# Patient Record
Sex: Female | Born: 1980
Health system: Southern US, Community
[De-identification: ages and names within clinical notes are randomized; demographics above are authoritative.]

## PROBLEM LIST (undated history)

## (undated) ENCOUNTER — Inpatient Hospital Stay (HOSPITAL_COMMUNITY): Payer: PRIVATE HEALTH INSURANCE | Source: Ambulatory Visit

## (undated) DIAGNOSIS — J45909 Unspecified asthma, uncomplicated: Secondary | ICD-10-CM

## (undated) DIAGNOSIS — Z86000 Personal history of in-situ neoplasm of breast: Secondary | ICD-10-CM

## (undated) DIAGNOSIS — Z8619 Personal history of other infectious and parasitic diseases: Secondary | ICD-10-CM

## (undated) HISTORY — PX: WISDOM TOOTH EXTRACTION: SHX21

---

## 2007-11-22 ENCOUNTER — Inpatient Hospital Stay (HOSPITAL_COMMUNITY): Admission: AD | Admit: 2007-11-22 | Discharge: 2007-11-24 | Payer: Self-pay | Admitting: Obstetrics and Gynecology

## 2011-05-10 LAB — CBC
HCT: 33.5 — ABNORMAL LOW
MCHC: 34.4
MCHC: 34.5
Platelets: 157
Platelets: 174
RBC: 3.22 — ABNORMAL LOW
RDW: 13.3

## 2011-07-21 LAB — OB RESULTS CONSOLE ANTIBODY SCREEN: Antibody Screen: NEGATIVE

## 2011-07-21 LAB — OB RESULTS CONSOLE RUBELLA ANTIBODY, IGM: Rubella: IMMUNE

## 2011-07-21 LAB — OB RESULTS CONSOLE HIV ANTIBODY (ROUTINE TESTING): HIV: NONREACTIVE

## 2011-07-21 LAB — OB RESULTS CONSOLE GC/CHLAMYDIA: Chlamydia: NEGATIVE

## 2011-07-21 LAB — OB RESULTS CONSOLE HEPATITIS B SURFACE ANTIGEN: Hepatitis B Surface Ag: NEGATIVE

## 2011-08-16 NOTE — L&D Delivery Note (Signed)
Delivery Note At 12:08 PM a viable female was delivered via Vaginal, Spontaneous Delivery (Presentation: Left Occiput Anterior).  APGAR: , ; weight .   Placenta status: Intact, Spontaneous.  Cord:  with the following complications: None.  Cord pH: not sent  Anesthesia: Epidural  Episiotomy: None Lacerations: 2nd degree;Periurethral Suture Repair: 3.0 vicryl rapide Est. Blood Loss (mL): 300  Mom to postpartum.  Baby to nursery-stable.  Meriel Pica 02/23/2012, 12:24 PM

## 2012-02-02 LAB — OB RESULTS CONSOLE GBS
GBS: NEGATIVE
Rh Type: POSITIVE

## 2012-02-23 ENCOUNTER — Inpatient Hospital Stay (HOSPITAL_COMMUNITY): Payer: PRIVATE HEALTH INSURANCE | Admitting: Anesthesiology

## 2012-02-23 ENCOUNTER — Inpatient Hospital Stay (HOSPITAL_COMMUNITY)
Admission: AD | Admit: 2012-02-23 | Discharge: 2012-02-25 | DRG: 775 | Disposition: A | Discharge status: Home / Self Care | Payer: PRIVATE HEALTH INSURANCE | Source: Ambulatory Visit | Attending: Obstetrics and Gynecology | Admitting: Obstetrics and Gynecology

## 2012-02-23 ENCOUNTER — Encounter (HOSPITAL_COMMUNITY): Payer: Self-pay | Admitting: Anesthesiology

## 2012-02-23 ENCOUNTER — Encounter (HOSPITAL_COMMUNITY): Payer: Self-pay | Admitting: *Deleted

## 2012-02-23 LAB — CBC
Hemoglobin: 11.1 g/dL — ABNORMAL LOW (ref 12.0–15.0)
MCHC: 33.3 g/dL (ref 30.0–36.0)
Platelets: 195 10*3/uL (ref 150–400)
RDW: 12.9 % (ref 11.5–15.5)

## 2012-02-23 MED ORDER — SIMETHICONE 80 MG PO CHEW: 80.0000 mg | CHEWABLE_TABLET | ORAL | Status: DC | PRN

## 2012-02-23 MED ORDER — OXYTOCIN BOLUS FROM INFUSION
250.0000 mL | Freq: Once | INTRAVENOUS | Status: DC
  Filled 2012-02-23: qty 500

## 2012-02-23 MED ORDER — SENNOSIDES-DOCUSATE SODIUM 8.6-50 MG PO TABS
2.0000 | ORAL_TABLET | Freq: Every day | ORAL | Status: DC
  Administered 2012-02-23 – 2012-02-24 (×2): 2 via ORAL

## 2012-02-23 MED ORDER — IBUPROFEN 800 MG PO TABS
800.0000 mg | ORAL_TABLET | Freq: Three times a day (TID) | ORAL | Status: DC | PRN
  Administered 2012-02-23 – 2012-02-25 (×5): 800 mg via ORAL
  Filled 2012-02-23 (×5): qty 1

## 2012-02-23 MED ORDER — LIDOCAINE HCL (PF) 1 % IJ SOLN
30.0000 mL | INTRAMUSCULAR | Status: DC | PRN
  Filled 2012-02-23: qty 30

## 2012-02-23 MED ORDER — EPHEDRINE 5 MG/ML INJ: 10.0000 mg | INTRAVENOUS | Status: DC | PRN

## 2012-02-23 MED ORDER — LACTATED RINGERS IV SOLN: 500.0000 mL | INTRAVENOUS | Status: DC | PRN

## 2012-02-23 MED ORDER — LIDOCAINE HCL (PF) 1 % IJ SOLN
INTRAMUSCULAR | Status: DC | PRN
  Administered 2012-02-23 (×2): 5 mL

## 2012-02-23 MED ORDER — DIBUCAINE 1 % RE OINT: 1.0000 "application " | TOPICAL_OINTMENT | RECTAL | Status: DC | PRN

## 2012-02-23 MED ORDER — PHENYLEPHRINE 40 MCG/ML (10ML) SYRINGE FOR IV PUSH (FOR BLOOD PRESSURE SUPPORT)
80.0000 ug | PREFILLED_SYRINGE | INTRAVENOUS | Status: DC | PRN
  Filled 2012-02-23: qty 5

## 2012-02-23 MED ORDER — OXYCODONE-ACETAMINOPHEN 5-325 MG PO TABS
1.0000 | ORAL_TABLET | Freq: Four times a day (QID) | ORAL | Status: DC | PRN
  Administered 2012-02-23 – 2012-02-25 (×7): 1 via ORAL
  Filled 2012-02-23 (×7): qty 1

## 2012-02-23 MED ORDER — BISACODYL 10 MG RE SUPP: 10.0000 mg | Freq: Every day | RECTAL | Status: DC | PRN

## 2012-02-23 MED ORDER — OXYCODONE-ACETAMINOPHEN 5-325 MG PO TABS: 1.0000 | ORAL_TABLET | ORAL | Status: DC | PRN

## 2012-02-23 MED ORDER — PHENYLEPHRINE 40 MCG/ML (10ML) SYRINGE FOR IV PUSH (FOR BLOOD PRESSURE SUPPORT): 80.0000 ug | PREFILLED_SYRINGE | INTRAVENOUS | Status: DC | PRN

## 2012-02-23 MED ORDER — FLEET ENEMA 7-19 GM/118ML RE ENEM: 1.0000 | ENEMA | RECTAL | Status: DC | PRN

## 2012-02-23 MED ORDER — FLEET ENEMA 7-19 GM/118ML RE ENEM: 1.0000 | ENEMA | Freq: Every day | RECTAL | Status: DC | PRN

## 2012-02-23 MED ORDER — PRENATAL MULTIVITAMIN CH
1.0000 | ORAL_TABLET | Freq: Every day | ORAL | Status: DC
  Administered 2012-02-23 – 2012-02-25 (×3): 1 via ORAL
  Filled 2012-02-23 (×3): qty 1

## 2012-02-23 MED ORDER — ZOLPIDEM TARTRATE 5 MG PO TABS: 5.0000 mg | ORAL_TABLET | Freq: Every evening | ORAL | Status: DC | PRN

## 2012-02-23 MED ORDER — BENZOCAINE-MENTHOL 20-0.5 % EX AERO
1.0000 "application " | INHALATION_SPRAY | CUTANEOUS | Status: DC | PRN
  Administered 2012-02-23: 1 via TOPICAL
  Filled 2012-02-23: qty 56

## 2012-02-23 MED ORDER — DIPHENHYDRAMINE HCL 50 MG/ML IJ SOLN: 12.5000 mg | INTRAMUSCULAR | Status: DC | PRN

## 2012-02-23 MED ORDER — ACETAMINOPHEN 325 MG PO TABS: 650.0000 mg | ORAL_TABLET | ORAL | Status: DC | PRN

## 2012-02-23 MED ORDER — ONDANSETRON HCL 4 MG PO TABS: 4.0000 mg | ORAL_TABLET | ORAL | Status: DC | PRN

## 2012-02-23 MED ORDER — CITRIC ACID-SODIUM CITRATE 334-500 MG/5ML PO SOLN: 30.0000 mL | ORAL | Status: DC | PRN

## 2012-02-23 MED ORDER — IBUPROFEN 600 MG PO TABS: 600.0000 mg | ORAL_TABLET | Freq: Four times a day (QID) | ORAL | Status: DC | PRN

## 2012-02-23 MED ORDER — OXYTOCIN 40 UNITS IN LACTATED RINGERS INFUSION - SIMPLE MED
62.5000 mL/h | Freq: Once | INTRAVENOUS | Status: AC
  Administered 2012-02-23: 62.5 mL/h via INTRAVENOUS
  Filled 2012-02-23: qty 1000

## 2012-02-23 MED ORDER — WITCH HAZEL-GLYCERIN EX PADS: 1.0000 "application " | MEDICATED_PAD | CUTANEOUS | Status: DC | PRN

## 2012-02-23 MED ORDER — TETANUS-DIPHTH-ACELL PERTUSSIS 5-2.5-18.5 LF-MCG/0.5 IM SUSP
0.5000 mL | Freq: Once | INTRAMUSCULAR | Status: DC
  Filled 2012-02-23: qty 0.5

## 2012-02-23 MED ORDER — MEASLES, MUMPS & RUBELLA VAC ~~LOC~~ INJ: 0.5000 mL | INJECTION | Freq: Once | SUBCUTANEOUS | Status: DC

## 2012-02-23 MED ORDER — LACTATED RINGERS IV SOLN
INTRAVENOUS | Status: DC
  Administered 2012-02-23 (×2): via INTRAVENOUS

## 2012-02-23 MED ORDER — LACTATED RINGERS IV SOLN: 500.0000 mL | Freq: Once | INTRAVENOUS | Status: DC

## 2012-02-23 MED ORDER — ONDANSETRON HCL 4 MG/2ML IJ SOLN: 4.0000 mg | INTRAMUSCULAR | Status: DC | PRN

## 2012-02-23 MED ORDER — DIPHENHYDRAMINE HCL 25 MG PO CAPS: 25.0000 mg | ORAL_CAPSULE | Freq: Four times a day (QID) | ORAL | Status: DC | PRN

## 2012-02-23 MED ORDER — EPHEDRINE 5 MG/ML INJ
10.0000 mg | INTRAVENOUS | Status: DC | PRN
  Filled 2012-02-23: qty 4

## 2012-02-23 MED ORDER — FENTANYL 2.5 MCG/ML BUPIVACAINE 1/10 % EPIDURAL INFUSION (WH - ANES)
14.0000 mL/h | INTRAMUSCULAR | Status: DC
Start: 2012-02-23 — End: 2012-02-23
  Administered 2012-02-23 (×2): 14 mL/h via EPIDURAL
  Filled 2012-02-23 (×2): qty 60

## 2012-02-23 MED ORDER — LANOLIN HYDROUS EX OINT: TOPICAL_OINTMENT | CUTANEOUS | Status: DC | PRN

## 2012-02-23 MED ORDER — ONDANSETRON HCL 4 MG/2ML IJ SOLN: 4.0000 mg | Freq: Four times a day (QID) | INTRAMUSCULAR | Status: DC | PRN

## 2012-02-23 NOTE — MAU Note (Signed)
PT SAYS SHE HAD MRSA ON FOOT- 2012.  DENIES HSV.

## 2012-02-23 NOTE — Anesthesia Preprocedure Evaluation (Signed)

## 2012-02-23 NOTE — H&P (Signed)
Alison Stevens is a 31 y.o. female presenting for labor. Maternal Medical History:  Reason for admission: Reason for admission: contractions.  Contractions: Onset was 3-5 hours ago.   Frequency: regular.   Perceived severity is moderate.    Fetal activity: Perceived fetal activity is normal.      OB History    Grav Para Term Preterm Abortions TAB SAB Ect Mult Living   2 1 1       1      History reviewed. No pertinent past medical history. Past Surgical History  Procedure Date  . Wisdom tooth extraction    Family History: family history is not on file. Social History:  reports that she has quit smoking. She has never used smokeless tobacco. She reports that she does not drink alcohol or use illicit drugs.   Prenatal Transfer Tool  Maternal Diabetes: No Genetic Screening: Normal Maternal Ultrasounds/Referrals: Normal Fetal Ultrasounds or other Referrals:  Other: see prenatal record Maternal Substance Abuse:  No Significant Maternal Medications:  None Significant Maternal Lab Results:  None Other Comments:  None  ROS  Dilation: 5 Effacement (%): 90 Station: -1 Exam by:: Dr. Marcelle Overlie  Blood pressure 122/72, pulse 77, temperature 98.4 F (36.9 C), temperature source Oral, resp. rate 20, height 5\' 6"  (1.676 m), weight 162 lb 4 oz (73.596 kg), SpO2 98.00%. Maternal Exam:  Uterine Assessment: Contraction strength is moderate.  Contraction frequency is regular.   Abdomen: Patient reports no abdominal tenderness. Fundal height is term.   Estimated fetal weight is AGA.   Fetal presentation: vertex  Introitus: Normal vulva. Normal vagina.  Amniotic fluid character: clear.  Pelvis: adequate for delivery.   Cervix: Cervix evaluated by digital exam.     Physical Exam  Constitutional: She is oriented to person, place, and time. She appears well-developed and well-nourished.  HENT:  Head: Normocephalic and atraumatic.  Eyes: Pupils are equal, round, and reactive to light.    Neck: Normal range of motion. Neck supple.  Cardiovascular: Normal rate and regular rhythm.   Respiratory: Effort normal and breath sounds normal.  GI:       Term FH, FHR 148  Genitourinary:       Now 5cm, AROM>>>clear AF  Neurological: She is alert and oriented to person, place, and time.  Skin: Skin is warm and dry.    Prenatal labs: ABO, Rh: --/Positive/-- (06/20 0000) Antibody: Negative (12/06 0000) Rubella: Immune (12/06 0000) RPR: Nonreactive (12/06 0000)  HBsAg: Negative (12/06 0000)  HIV: Non-reactive (12/06 0000)  GBS: Negative (06/20 0000)   Assessment/Plan: Term IUP, labor, now with AROM, epid in   [facility] M 02/23/2012, 7:27 AM

## 2012-02-23 NOTE — Anesthesia Procedure Notes (Signed)
Epidural Patient location during procedure: OB Start time: 02/23/2012 7:11 AM  Staffing Anesthesiologist: Brayton Caves R Performed by: anesthesiologist   Preanesthetic Checklist Completed: patient identified, site marked, surgical consent, pre-op evaluation, timeout performed, IV checked, risks and benefits discussed and monitors and equipment checked  Epidural Patient position: sitting Prep: site prepped and draped and DuraPrep Patient monitoring: continuous pulse ox and blood pressure Approach: midline Injection technique: LOR air and LOR saline  Needle:  Needle type: Tuohy  Needle gauge: 17 G Needle length: 9 cm Needle insertion depth: 5 cm cm Catheter type: closed end flexible Catheter size: 19 Gauge Catheter at skin depth: 10 cm Test dose: negative  Assessment Events: blood not aspirated, injection not painful, no injection resistance, negative IV test and no paresthesia  Additional Notes Patient identified.  Risk benefits discussed including failed block, incomplete pain control, headache, nerve damage, paralysis, blood pressure changes, nausea, vomiting, reactions to medication both toxic or allergic, and postpartum back pain.  Patient expressed understanding and wished to proceed.  All questions were answered.  Sterile technique used throughout procedure and epidural site dressed with sterile barrier dressing. No paresthesia or other complications noted.The patient did not experience any signs of intravascular injection such as tinnitus or metallic taste in mouth nor signs of intrathecal spread such as rapid motor block. Please see nursing notes for vital signs.

## 2012-02-23 NOTE — Anesthesia Postprocedure Evaluation (Signed)
  Anesthesia Post-op Note  Patient: Alison Stevens  Procedure(s) Performed: * No procedures listed *  Patient Location: PACU and Mother/Baby  Anesthesia Type: Epidural  Level of Consciousness: awake, alert , oriented and patient cooperative  Airway and Oxygen Therapy: Patient Spontanous Breathing  Post-op Pain: none  Post-op Assessment: Post-op Vital signs reviewed  Post-op Vital Signs: Reviewed and stable  Complications: No apparent anesthesia complications Anesthesia Post Note  Patient: Alison Stevens  Procedure(s) Performed: * No procedures listed *  Anesthesia type: Epidural  Patient location: Mother/Baby  Post pain: Pain level controlled  Post assessment: Post-op Vital signs reviewed  Last Vitals:  Filed Vitals:   02/23/12 1530  BP: 108/73  Pulse: 85  Temp: 36.8 C  Resp: 18    Post vital signs: Reviewed  Level of consciousness: awake  Complications: No apparent anesthesia complications

## 2012-02-24 LAB — CBC
HCT: 30.9 % — ABNORMAL LOW (ref 36.0–46.0)
RDW: 13 % (ref 11.5–15.5)
WBC: 11 10*3/uL — ABNORMAL HIGH (ref 4.0–10.5)

## 2012-02-24 NOTE — Progress Notes (Signed)
Post Partum Day 1 Subjective: no complaints, up ad lib, voiding, tolerating PO and + flatus  Objective: Blood pressure 105/73, pulse 72, temperature 97.6 F (36.4 C), temperature source Oral, resp. rate 18, height 5\' 6"  (1.676 m), weight 73.596 kg (162 lb 4 oz), SpO2 97.00%, unknown if currently breastfeeding.  Physical Exam:  General: alert and cooperative Lochia: appropriate Uterine Fundus: firm Incision: perineum intact DVT Evaluation: No evidence of DVT seen on physical exam.   Basename 02/24/12 0525 02/23/12 0445  HGB 10.2* 11.1*  HCT 30.9* 33.3*    Assessment/Plan: Plan for discharge tomorrow   LOS: 1 day   Amarea Macdowell G 02/24/2012, 8:49 AM

## 2012-02-25 MED ORDER — OXYCODONE-ACETAMINOPHEN 5-325 MG PO TABS: 1.0000 | ORAL_TABLET | Freq: Four times a day (QID) | ORAL | Status: AC | PRN

## 2012-02-25 MED ORDER — IBUPROFEN 800 MG PO TABS: 800.0000 mg | ORAL_TABLET | Freq: Three times a day (TID) | ORAL | Status: AC | PRN

## 2012-02-25 NOTE — Discharge Summary (Signed)
Obstetric Discharge Summary Reason for Admission: onset of labor Prenatal Procedures: ultrasound Intrapartum Procedures: spontaneous vaginal delivery Postpartum Procedures: none Complications-Operative and Postpartum: none Hemoglobin  Date Value Range Status  02/24/2012 10.2* 12.0 - 15.0 g/dL Final     HCT  Date Value Range Status  02/24/2012 30.9* 36.0 - 46.0 % Final    Physical Exam:  General: alert and cooperative Lochia: appropriate Uterine Fundus: firm Incision: n/a DVT Evaluation: No evidence of DVT seen on physical exam.  Discharge Diagnoses: Term Pregnancy-delivered  Discharge Information: Date: 02/25/2012 Activity: pelvic rest Diet: routine Medications: PNV, Ibuprofen and Percocet Condition: stable Instructions: refer to practice specific booklet Discharge to: home Follow-up Information    Schedule an appointment as soon as possible for a visit in 6 weeks to follow up.         Newborn Data: Live born female  Birth Weight: 7 lb 5 oz (3317 g) APGAR: 8, 9  Home with mother.  Samul Mcinroy 02/25/2012, 8:56 AM

## 2012-02-29 ENCOUNTER — Inpatient Hospital Stay (HOSPITAL_COMMUNITY): Admission: RE | Admit: 2012-02-29 | Payer: PRIVATE HEALTH INSURANCE | Source: Ambulatory Visit

## 2013-01-02 ENCOUNTER — Other Ambulatory Visit (HOSPITAL_COMMUNITY): Payer: Self-pay | Admitting: Oncology

## 2014-06-16 ENCOUNTER — Encounter (HOSPITAL_COMMUNITY): Payer: Self-pay | Admitting: *Deleted

## 2017-08-25 ENCOUNTER — Other Ambulatory Visit: Payer: Self-pay

## 2017-08-25 ENCOUNTER — Encounter (HOSPITAL_COMMUNITY): Payer: Self-pay | Admitting: *Deleted

## 2017-08-28 ENCOUNTER — Encounter (HOSPITAL_COMMUNITY): Payer: Self-pay | Admitting: Certified Registered Nurse Anesthetist

## 2017-08-29 ENCOUNTER — Ambulatory Visit (HOSPITAL_COMMUNITY)
Admission: RE | Admit: 2017-08-29 | Payer: PRIVATE HEALTH INSURANCE | Source: Ambulatory Visit | Admitting: Obstetrics and Gynecology

## 2017-08-29 HISTORY — DX: Personal history of other infectious and parasitic diseases: Z86.19

## 2017-08-29 SURGERY — DILATION AND EVACUATION, UTERUS
Anesthesia: Choice

## 2017-08-31 ENCOUNTER — Inpatient Hospital Stay (HOSPITAL_COMMUNITY): Payer: PRIVATE HEALTH INSURANCE | Admitting: Anesthesiology

## 2017-08-31 ENCOUNTER — Encounter (HOSPITAL_COMMUNITY): Payer: Self-pay | Admitting: *Deleted

## 2017-08-31 ENCOUNTER — Encounter (HOSPITAL_COMMUNITY)
Admission: AD | Disposition: A | Discharge status: Home / Self Care | Payer: Self-pay | Source: Ambulatory Visit | Attending: Obstetrics and Gynecology

## 2017-08-31 ENCOUNTER — Ambulatory Visit (HOSPITAL_COMMUNITY)
Admission: AD | Admit: 2017-08-31 | Discharge: 2017-08-31 | Disposition: A | Discharge status: Home / Self Care | Payer: PRIVATE HEALTH INSURANCE | Source: Ambulatory Visit | Attending: Obstetrics and Gynecology | Admitting: Obstetrics and Gynecology

## 2017-08-31 DIAGNOSIS — Z87891 Personal history of nicotine dependence: Secondary | ICD-10-CM | POA: Diagnosis not present

## 2017-08-31 DIAGNOSIS — Z885 Allergy status to narcotic agent status: Secondary | ICD-10-CM | POA: Insufficient documentation

## 2017-08-31 DIAGNOSIS — O034 Incomplete spontaneous abortion without complication: Secondary | ICD-10-CM | POA: Diagnosis not present

## 2017-08-31 HISTORY — PX: DILATION AND EVACUATION: SHX1459

## 2017-08-31 LAB — TYPE AND SCREEN
ABO/RH(D): O POS
ANTIBODY SCREEN: NEGATIVE

## 2017-08-31 LAB — ABO/RH: ABO/RH(D): O POS

## 2017-08-31 LAB — CBC
HCT: 35.5 % — ABNORMAL LOW (ref 36.0–46.0)
Hemoglobin: 12.5 g/dL (ref 12.0–15.0)
MCH: 30.6 pg (ref 26.0–34.0)
MCHC: 35.2 g/dL (ref 30.0–36.0)
MCV: 86.8 fL (ref 78.0–100.0)
PLATELETS: 198 10*3/uL (ref 150–400)
RBC: 4.09 MIL/uL (ref 3.87–5.11)
RDW: 12.1 % (ref 11.5–15.5)
WBC: 11 10*3/uL — ABNORMAL HIGH (ref 4.0–10.5)

## 2017-08-31 SURGERY — DILATION AND EVACUATION, UTERUS
Anesthesia: General | Site: Uterus | Wound class: Clean

## 2017-08-31 MED ORDER — LIDOCAINE HCL 1 % IJ SOLN
INTRAMUSCULAR | Status: AC
  Filled 2017-08-31: qty 20

## 2017-08-31 MED ORDER — ACETAMINOPHEN 325 MG PO TABS: 325.0000 mg | ORAL_TABLET | ORAL | Status: DC | PRN

## 2017-08-31 MED ORDER — LACTATED RINGERS IV SOLN
INTRAVENOUS | Status: DC
  Administered 2017-08-31: 18:00:00 via INTRAVENOUS

## 2017-08-31 MED ORDER — DEXAMETHASONE SODIUM PHOSPHATE 4 MG/ML IJ SOLN
INTRAMUSCULAR | Status: AC
Start: 2017-08-31 — End: 2017-08-31
  Filled 2017-08-31: qty 1

## 2017-08-31 MED ORDER — SCOPOLAMINE 1 MG/3DAYS TD PT72
MEDICATED_PATCH | TRANSDERMAL | Status: AC
  Filled 2017-08-31: qty 1

## 2017-08-31 MED ORDER — OXYCODONE HCL 5 MG/5ML PO SOLN: 5.0000 mg | Freq: Once | ORAL | Status: AC | PRN

## 2017-08-31 MED ORDER — MIDAZOLAM HCL 2 MG/2ML IJ SOLN
INTRAMUSCULAR | Status: DC | PRN
  Administered 2017-08-31: 2 mg via INTRAVENOUS

## 2017-08-31 MED ORDER — ONDANSETRON HCL 4 MG/2ML IJ SOLN
4.0000 mg | Freq: Once | INTRAMUSCULAR | Status: AC | PRN
  Administered 2017-08-31: 4 mg via INTRAVENOUS

## 2017-08-31 MED ORDER — FAMOTIDINE IN NACL 20-0.9 MG/50ML-% IV SOLN
20.0000 mg | Freq: Once | INTRAVENOUS | Status: AC
  Administered 2017-08-31: 20 mg via INTRAVENOUS
  Filled 2017-08-31: qty 50

## 2017-08-31 MED ORDER — SCOPOLAMINE 1 MG/3DAYS TD PT72: 1.0000 | MEDICATED_PATCH | Freq: Once | TRANSDERMAL | Status: DC

## 2017-08-31 MED ORDER — SOD CITRATE-CITRIC ACID 500-334 MG/5ML PO SOLN
30.0000 mL | Freq: Once | ORAL | Status: AC
Start: 2017-08-31 — End: 2017-08-31
  Administered 2017-08-31: 30 mL via ORAL
  Filled 2017-08-31: qty 15

## 2017-08-31 MED ORDER — MEPERIDINE HCL 25 MG/ML IJ SOLN: 6.2500 mg | INTRAMUSCULAR | Status: DC | PRN

## 2017-08-31 MED ORDER — OXYCODONE HCL 5 MG PO TABS
ORAL_TABLET | ORAL | Status: AC
  Filled 2017-08-31: qty 1

## 2017-08-31 MED ORDER — ONDANSETRON HCL 4 MG/2ML IJ SOLN
INTRAMUSCULAR | Status: AC
  Filled 2017-08-31: qty 2

## 2017-08-31 MED ORDER — SODIUM CHLORIDE 0.9 % IV SOLN: INTRAVENOUS | Status: DC

## 2017-08-31 MED ORDER — LIDOCAINE HCL 1 % IJ SOLN
INTRAMUSCULAR | Status: DC | PRN
  Administered 2017-08-31: 20 mL

## 2017-08-31 MED ORDER — SCOPOLAMINE 1 MG/3DAYS TD PT72
1.0000 | MEDICATED_PATCH | Freq: Once | TRANSDERMAL | Status: DC
  Administered 2017-08-31: 1.5 mg via TRANSDERMAL
  Filled 2017-08-31: qty 1

## 2017-08-31 MED ORDER — LACTATED RINGERS IV SOLN
INTRAVENOUS | Status: DC
  Administered 2017-08-31: 17:00:00 via INTRAVENOUS

## 2017-08-31 MED ORDER — MIDAZOLAM HCL 2 MG/2ML IJ SOLN
INTRAMUSCULAR | Status: AC
  Filled 2017-08-31: qty 2

## 2017-08-31 MED ORDER — CEFAZOLIN SODIUM-DEXTROSE 2-4 GM/100ML-% IV SOLN
2.0000 g | INTRAVENOUS | Status: AC
  Administered 2017-08-31: 2 g via INTRAVENOUS
  Filled 2017-08-31: qty 100

## 2017-08-31 MED ORDER — DEXAMETHASONE SODIUM PHOSPHATE 4 MG/ML IJ SOLN
INTRAMUSCULAR | Status: AC
  Filled 2017-08-31: qty 1

## 2017-08-31 MED ORDER — ACETAMINOPHEN 160 MG/5ML PO SOLN: 325.0000 mg | ORAL | Status: DC | PRN

## 2017-08-31 MED ORDER — FENTANYL CITRATE (PF) 100 MCG/2ML IJ SOLN
25.0000 ug | INTRAMUSCULAR | Status: DC | PRN
  Administered 2017-08-31 (×2): 50 ug via INTRAVENOUS

## 2017-08-31 MED ORDER — KETOROLAC TROMETHAMINE 30 MG/ML IJ SOLN
INTRAMUSCULAR | Status: AC
  Filled 2017-08-31: qty 1

## 2017-08-31 MED ORDER — KETOROLAC TROMETHAMINE 30 MG/ML IJ SOLN: 30.0000 mg | Freq: Once | INTRAMUSCULAR | Status: DC | PRN

## 2017-08-31 MED ORDER — OXYTOCIN 40 UNITS IN LACTATED RINGERS INFUSION - SIMPLE MED
INTRAVENOUS | Status: DC | PRN
  Administered 2017-08-31: 40 mL via INTRAVENOUS

## 2017-08-31 MED ORDER — DEXAMETHASONE SODIUM PHOSPHATE 4 MG/ML IJ SOLN
INTRAMUSCULAR | Status: DC | PRN
  Administered 2017-08-31: 4 mg via INTRAVENOUS

## 2017-08-31 MED ORDER — PROPOFOL 10 MG/ML IV BOLUS
INTRAVENOUS | Status: AC
  Filled 2017-08-31: qty 20

## 2017-08-31 MED ORDER — KETOROLAC TROMETHAMINE 30 MG/ML IJ SOLN
INTRAMUSCULAR | Status: DC | PRN
  Administered 2017-08-31: 30 mg via INTRAVENOUS

## 2017-08-31 MED ORDER — PROPOFOL 10 MG/ML IV BOLUS
INTRAVENOUS | Status: DC | PRN
  Administered 2017-08-31 (×2): 30 mg via INTRAVENOUS
  Administered 2017-08-31: 40 mg via INTRAVENOUS
  Administered 2017-08-31: 50 mg via INTRAVENOUS
  Administered 2017-08-31: 30 mg via INTRAVENOUS

## 2017-08-31 MED ORDER — ONDANSETRON HCL 4 MG/2ML IJ SOLN
INTRAMUSCULAR | Status: DC | PRN
  Administered 2017-08-31: 4 mg via INTRAVENOUS

## 2017-08-31 MED ORDER — FENTANYL CITRATE (PF) 100 MCG/2ML IJ SOLN
INTRAMUSCULAR | Status: AC
  Administered 2017-08-31: 50 ug via INTRAVENOUS
  Filled 2017-08-31: qty 2

## 2017-08-31 MED ORDER — LIDOCAINE HCL (CARDIAC) 20 MG/ML IV SOLN
INTRAVENOUS | Status: AC
  Filled 2017-08-31: qty 5

## 2017-08-31 MED ORDER — FENTANYL CITRATE (PF) 100 MCG/2ML IJ SOLN
INTRAMUSCULAR | Status: DC | PRN
  Administered 2017-08-31 (×3): 50 ug via INTRAVENOUS

## 2017-08-31 MED ORDER — FENTANYL CITRATE (PF) 250 MCG/5ML IJ SOLN
INTRAMUSCULAR | Status: AC
  Filled 2017-08-31: qty 5

## 2017-08-31 MED ORDER — OXYCODONE HCL 5 MG PO TABS
5.0000 mg | ORAL_TABLET | Freq: Once | ORAL | Status: AC | PRN
  Administered 2017-08-31: 5 mg via ORAL

## 2017-08-31 MED ORDER — LIDOCAINE HCL (CARDIAC) 20 MG/ML IV SOLN
INTRAVENOUS | Status: DC | PRN
  Administered 2017-08-31: 50 mg via INTRAVENOUS

## 2017-08-31 SURGICAL SUPPLY — 18 items
CATH ROBINSON RED A/P 16FR (CATHETERS) ×3 IMPLANT
DECANTER SPIKE VIAL GLASS SM (MISCELLANEOUS) ×3 IMPLANT
GLOVE BIO SURGEON STRL SZ7.5 (GLOVE) ×3 IMPLANT
GLOVE BIOGEL PI IND STRL 7.0 (GLOVE) ×1 IMPLANT
GLOVE BIOGEL PI INDICATOR 7.0 (GLOVE) ×2
GOWN STRL REUS W/TWL LRG LVL3 (GOWN DISPOSABLE) ×6 IMPLANT
KIT BERKELEY 1ST TRIMESTER 3/8 (MISCELLANEOUS) ×3 IMPLANT
NS IRRIG 1000ML POUR BTL (IV SOLUTION) ×3 IMPLANT
PACK VAGINAL MINOR WOMEN LF (CUSTOM PROCEDURE TRAY) ×3 IMPLANT
PAD OB MATERNITY 4.3X12.25 (PERSONAL CARE ITEMS) ×3 IMPLANT
PAD PREP 24X48 CUFFED NSTRL (MISCELLANEOUS) ×3 IMPLANT
SET BERKELEY SUCTION TUBING (SUCTIONS) ×3 IMPLANT
TOWEL OR 17X24 6PK STRL BLUE (TOWEL DISPOSABLE) ×6 IMPLANT
VACURETTE 10 RIGID CVD (CANNULA) IMPLANT
VACURETTE 6 ASPIR F TIP BERK (CANNULA) ×3 IMPLANT
VACURETTE 7MM CVD STRL WRAP (CANNULA) ×3 IMPLANT
VACURETTE 8 RIGID CVD (CANNULA) IMPLANT
VACURETTE 9 RIGID CVD (CANNULA) IMPLANT

## 2017-08-31 NOTE — MAU Note (Signed)
Pt here for D&E.  Is 9 weeks, no FHR.

## 2017-08-31 NOTE — H&P (Signed)
Alison Stevens is an 37 y.o. female with inevitable abortion. Followed in office for S<D on early U/S. Sac size = 6 2/7 weeks with twin fetal poles. FHT in 1 pole several days ago. Now with increased bleeding and cramping. U/S in office showed no FHT in both poles.  Pertinent Gynecological History: Menses: flow is moderate Bleeding: N/A Contraception: N/A DES exposure: denies Blood transfusions: none Sexually transmitted diseases: no past history Previous GYN Procedures: none  Last mammogram: N/A Date: N/A Last pap: normal Date: 2018 OB History: G3, P2   Menstrual History: Menarche age: unknown Patient's last menstrual period was 06/29/2017 (exact date).    Past Medical History:  Diagnosis Date  . H/O cold sores   . SVD (spontaneous vaginal delivery)    x 2    Past Surgical History:  Procedure Laterality Date  . WISDOM TOOTH EXTRACTION      History reviewed. No pertinent family history.  Social History:  reports that she has quit smoking. Her smoking use included cigarettes. She has a 1.00 pack-year smoking history. she has never used smokeless tobacco. She reports that she drinks alcohol. She reports that she does not use drugs.  Allergies:  Allergies  Allergen Reactions  . Vicodin [Hydrocodone-Acetaminophen] Nausea And Vomiting    No medications prior to admission.    Review of Systems  Constitutional: Negative for fever.  Eyes: Negative for blurred vision.    Blood pressure (!) 106/55, pulse 96, temperature 98.2 F (36.8 C), temperature source Oral, resp. rate 20, last menstrual period 06/29/2017, unknown if currently breastfeeding. Physical Exam  Cardiovascular: Normal rate and regular rhythm.  Respiratory: Effort normal and breath sounds normal.  GI: Soft. There is no tenderness.  Genitourinary:  Genitourinary Comments: Uterus 6-8 weeks size Cervix 1 cm dilated    Results for orders placed or performed during the hospital encounter of 08/31/17 (from  the past 24 hour(s))  CBC     Status: Abnormal   Collection Time: 08/31/17  4:44 PM  Result Value Ref Range   WBC 11.0 (H) 4.0 - 10.5 K/uL   RBC 4.09 3.87 - 5.11 MIL/uL   Hemoglobin 12.5 12.0 - 15.0 g/dL   HCT 40.935.5 (L) 81.136.0 - 91.446.0 %   MCV 86.8 78.0 - 100.0 fL   MCH 30.6 26.0 - 34.0 pg   MCHC 35.2 30.0 - 36.0 g/dL   RDW 78.212.1 95.611.5 - 21.315.5 %   Platelets 198 150 - 400 K/uL  Type and screen [facility]     Status: None (Preliminary result)   Collection Time: 08/31/17  4:44 PM  Result Value Ref Range   ABO/RH(D) O POS    Antibody Screen PENDING    Sample Expiration 09/03/2017     No results found.  Assessment/Plan: 37 yo with inevitable abortion D/W patient D&E and risks including infection, uterine perforation and organ damage, bleeding/transfusion-HIV/Hep, DVT/PE, pneumonia, IU synechiae and infertility Patient states she understands and agrees Roselle LocusJames E Cloa Bushong II 08/31/2017, 5:29 PM

## 2017-08-31 NOTE — Anesthesia Preprocedure Evaluation (Addendum)
Anesthesia Evaluation  Patient identified by MRN, date of birth, ID band Patient awake    Reviewed: Allergy & Precautions, H&P , NPO status , Patient's Chart, lab work & pertinent test results, reviewed documented beta blocker date and time   Airway Mallampati: II  TM Distance: >3 FB Neck ROM: full    Dental no notable dental hx.    Pulmonary neg pulmonary ROS, former smoker,    Pulmonary exam normal breath sounds clear to auscultation       Cardiovascular negative cardio ROS Normal cardiovascular exam Rhythm:regular Rate:Normal     Neuro/Psych negative neurological ROS  negative psych ROS   GI/Hepatic negative GI ROS, Neg liver ROS,   Endo/Other  negative endocrine ROS  Renal/GU negative Renal ROS  negative genitourinary   Musculoskeletal   Abdominal   Peds  Hematology negative hematology ROS (+)   Anesthesia Other Findings   Reproductive/Obstetrics (+) Pregnancy                             Anesthesia Physical  Anesthesia Plan  ASA: II  Anesthesia Plan: MAC   Post-op Pain Management:    Induction: Intravenous  PONV Risk Score and Plan: 3 and Ondansetron, Dexamethasone and Treatment may vary due to age or medical condition  Airway Management Planned: LMA, Nasal Cannula and Natural Airway  Additional Equipment:   Intra-op Plan:   Post-operative Plan: Extubation in OR  Informed Consent: I have reviewed the patients History and Physical, chart, labs and discussed the procedure including the risks, benefits and alternatives for the proposed anesthesia with the patient or authorized representative who has indicated his/her understanding and acceptance.     Plan Discussed with: CRNA and Anesthesiologist  Anesthesia Plan Comments: (  )       Anesthesia Quick Evaluation

## 2017-08-31 NOTE — Brief Op Note (Signed)
08/31/2017  6:06 PM  PATIENT:  Alison Stevens  37 y.o. female  PRE-OPERATIVE DIAGNOSIS:  incomplete spontaneous abortion, dilation and evacuation of uterus  POST-OPERATIVE DIAGNOSIS:  incomplete spontaneous abortion, dilation and evacuation of uterus  PROCEDURE:  Procedure(s): DILATATION AND EVACUATION (N/A)  SURGEON:  Surgeon(s) and Role:    * Everlene Farrier, MD - Primary  PHYSICIAN ASSISTANT:   ASSISTANTS: none   ANESTHESIA:   paracervical block and MAC  EBL:  100   BLOOD ADMINISTERED:none  DRAINS: none   LOCAL MEDICATIONS USED:  LIDOCAINE  and Amount: 20 ml  SPECIMEN:  Source of Specimen:  products of conception  DISPOSITION OF SPECIMEN:  PATHOLOGY  COUNTS:  YES  TOURNIQUET:  * No tourniquets in log *  DICTATION: .Other Dictation: Dictation Number [id number]  PLAN OF CARE: Discharge to home after PACU  PATIENT DISPOSITION:  PACU - hemodynamically stable.   Delay start of Pharmacological VTE agent (>24hrs) due to surgical blood loss or risk of bleeding: not applicable

## 2017-08-31 NOTE — MAU Note (Signed)
Pt sent from office for a D&E.

## 2017-08-31 NOTE — Transfer of Care (Signed)
Immediate Anesthesia Transfer of Care Note  Patient: Alison Stevens  Procedure(s) Performed: DILATATION AND EVACUATION (N/A Uterus)  Patient Location: PACU  Anesthesia Type:MAC  Level of Consciousness: awake, alert  and oriented  Airway & Oxygen Therapy: Patient Spontanous Breathing  Post-op Assessment: Report given to RN and Post -op Vital signs reviewed and stable  Post vital signs: Reviewed and stable BP 113/58, HR 108, RR 18, SaO2 100%  Last Vitals:  Vitals:   08/31/17 1655  BP: (!) 106/55  Pulse: 96  Resp: 20  Temp: 36.8 C    Last Pain:  Vitals:   08/31/17 1655  TempSrc: Oral  PainSc:       Patients Stated Pain Goal: 0 (73/71/06 2694)  Complications: No apparent anesthesia complications

## 2017-09-01 ENCOUNTER — Encounter (HOSPITAL_COMMUNITY): Payer: Self-pay | Admitting: Obstetrics and Gynecology

## 2017-09-01 NOTE — Op Note (Signed)
NAME:  Alison Stevens, Alison Stevens                  ACCOUNT NO.:  MEDICAL RECORD NO.:  [id number]  LOCATION:                                 FACILITY:  PHYSICIAN:  Daleen Bo. Gaetano Net, M.D.      DATE OF BIRTH:  DATE OF PROCEDURE:  08/31/2017 DATE OF DISCHARGE:                              OPERATIVE REPORT   PREOPERATIVE DIAGNOSIS:  Inevitable abortion.  POSTOPERATIVE DIAGNOSIS:  Inevitable abortion.  PROCEDURE:  Dilation and evacuation.  SURGEON:  Daleen Bo. Gaetano Net, M.D.  ANESTHESIA:  MAC, Heriberto Antigua, M.D., and paracervical block, 1% lidocaine plain, 20 mL.  ESTIMATED BLOOD LOSS:  100 mL.  SPECIMENS:  Products of conception to Pathology.  INDICATIONS AND CONSENT:  This patient is a 37 year old, G3, P2, at 9 weeks by dates.  Ultrasound showed a lagging intrauterine gestational sac with a twin fetal pole.  A fetal heart beat was noted earlier this week.  Her bleeding and cramping have picked up.  Repeat ultrasound today shows no heartbeat in either fetal pole and examination reveals cervix to be 1 cm dilated.  Options have been reviewed and the patient favors dilation and evacuation.  Potential risks and complications have been discussed preoperatively including, but not limited to, infection, uterine perforation, organ damage, bleeding requiring transfusion of blood products with HIV and hepatitis acquisition, intrauterine synechiae and secondary infertility.  All questions were answered. Consent was signed on the chart.  DESCRIPTION OF PROCEDURE:  The patient was taken to the operating room, where she was identified, placed in dorsal supine position and intravenous sedation was given.  She was placed in a dorsal lithotomy position.  Time-out was undertaken.  She was prepped with Betadine. Bladder straight catheterized and draped in a sterile fashion. Examination reveals the uterus to be about 6 to 8 weeks in size and tissue was noted in the vaginal vault.  Tissue was sent to  Pathology.  A bivalve speculum was placed and the anterior cervical lip was then injected with 1% lidocaine and grasped with a single-tooth tenaculum.  A paracervical block was placed at the 1, 4, 5, 7, 8, and 10 o'clock positions with approximately 20 mL of the same solution.  Cervix was already dilated.  A #7 curved curette easily passes and suction and alternating sharp curettage were carried out for products of conception.  Pitocin was started in the IV after the initial pass.  Cavity was clean.  Good hemostasis was noted.  All instruments were removed.  All counts were correct.  Blood type is O positive.  She was taken to the PACU in stable condition.     Daleen Bo Gaetano Net, M.D.     JET/MEDQ  D:  08/31/2017  T:  09/01/2017  Job:  [id number]

## 2017-09-04 NOTE — Anesthesia Postprocedure Evaluation (Signed)
Anesthesia Post Note  Patient: Alison BirchwoodJessica A Stevens  Procedure(s) Performed: DILATATION AND EVACUATION (N/A Uterus)     Patient location during evaluation: PACU Anesthesia Type: General Level of consciousness: awake and alert Pain management: pain level controlled Vital Signs Assessment: post-procedure vital signs reviewed and stable Respiratory status: spontaneous breathing, nonlabored ventilation and respiratory function stable Cardiovascular status: blood pressure returned to baseline and stable Postop Assessment: no apparent nausea or vomiting Anesthetic complications: no    Last Vitals:  Vitals:   08/31/17 1945 08/31/17 2000  BP: 120/73 117/73  Pulse: 87 97  Resp: 12 11  Temp:  36.9 C  SpO2: 98% 99%    Last Pain:  Vitals:   09/04/17 1607  TempSrc:   PainSc: 2    Pain Goal: Patients Stated Pain Goal: 0 (08/31/17 1648)               Cecile HearingStephen Edward Turk

## 2018-06-21 ENCOUNTER — Encounter: Payer: Self-pay | Admitting: Family Medicine

## 2018-06-21 ENCOUNTER — Ambulatory Visit (INDEPENDENT_AMBULATORY_CARE_PROVIDER_SITE_OTHER): Payer: PRIVATE HEALTH INSURANCE | Admitting: Family Medicine

## 2018-06-21 VITALS — BP 110/70 | HR 70 | Temp 98.1°F | Ht 66.0 in | Wt 127.4 lb

## 2018-06-21 DIAGNOSIS — R197 Diarrhea, unspecified: Secondary | ICD-10-CM

## 2018-06-21 DIAGNOSIS — H209 Unspecified iridocyclitis: Secondary | ICD-10-CM | POA: Diagnosis not present

## 2018-06-21 DIAGNOSIS — H5702 Anisocoria: Secondary | ICD-10-CM

## 2018-06-21 DIAGNOSIS — Z0001 Encounter for general adult medical examination with abnormal findings: Secondary | ICD-10-CM | POA: Diagnosis not present

## 2018-06-21 LAB — URINALYSIS, ROUTINE W REFLEX MICROSCOPIC
BILIRUBIN URINE: NEGATIVE
HGB URINE DIPSTICK: NEGATIVE
KETONES UR: NEGATIVE
NITRITE: NEGATIVE
PH: 7 (ref 5.0–8.0)
RBC / HPF: NONE SEEN (ref 0–?)
Specific Gravity, Urine: <=1.005 — AB (ref 1.000–1.030)
TOTAL PROTEIN, URINE-UPE24: NEGATIVE
UROBILINOGEN UA: 0.2 (ref 0.0–1.0)
Urine Glucose: NEGATIVE

## 2018-06-21 LAB — COMPREHENSIVE METABOLIC PANEL
ALT: 6 U/L (ref 0–35)
AST: 14 U/L (ref 0–37)
Albumin: 5.1 g/dL (ref 3.5–5.2)
Alkaline Phosphatase: 48 U/L (ref 39–117)
BILIRUBIN TOTAL: 0.4 mg/dL (ref 0.2–1.2)
BUN: 9 mg/dL (ref 6–23)
CALCIUM: 9.9 mg/dL (ref 8.4–10.5)
CHLORIDE: 102 meq/L (ref 96–112)
CO2: 29 meq/L (ref 19–32)
CREATININE: 0.69 mg/dL (ref 0.40–1.20)
GFR: 101.37 mL/min (ref >60.00)
Glucose, Bld: 95 mg/dL (ref 70–99)
Potassium: 4.4 mEq/L (ref 3.5–5.1)
SODIUM: 138 meq/L (ref 135–145)
Total Protein: 7.9 g/dL (ref 6.0–8.3)

## 2018-06-21 LAB — CBC
HCT: 40.1 % (ref 36.0–46.0)
HEMOGLOBIN: 13.6 g/dL (ref 12.0–15.0)
MCHC: 34 g/dL (ref 30.0–36.0)
MCV: 90.7 fl (ref 78.0–100.0)
PLATELETS: 268 10*3/uL (ref 150.0–400.0)
RBC: 4.43 Mil/uL (ref 3.87–5.11)
RDW: 12.6 % (ref 11.5–15.5)
WBC: 7.3 10*3/uL (ref 4.0–10.5)

## 2018-06-21 LAB — LIPID PANEL
CHOL/HDL RATIO: 3
Cholesterol: 201 mg/dL — ABNORMAL HIGH (ref 0–200)
HDL: 57.8 mg/dL (ref >39.00)
LDL Cholesterol: 120 mg/dL — ABNORMAL HIGH (ref 0–99)
NONHDL: 142.94
TRIGLYCERIDES: 117 mg/dL (ref 0.0–149.0)
VLDL: 23.4 mg/dL (ref 0.0–40.0)

## 2018-06-21 LAB — LDL CHOLESTEROL, DIRECT: Direct LDL: 134 mg/dL

## 2018-06-21 LAB — VITAMIN D 25 HYDROXY (VIT D DEFICIENCY, FRACTURES): VITD: 30.41 ng/mL (ref 30.00–100.00)

## 2018-06-21 NOTE — Patient Instructions (Signed)

## 2018-06-21 NOTE — Progress Notes (Signed)
Subjective:  Patient ID: Alison Stevens, female    DOB: [birth year]  Age: 37 y.o. MRN: [id number]  CC: No chief complaint on file.   HPI Alison Stevens presents for treatment and evaluation of a 1 day history of pain in OD with light sensitivity.  There was no injury.  Complains of mild headache.  Removed contact.  Contact remains in OS.  She has had a runny nose with this.  She has a recent history of a bump in her right upper lid margin that seems to have resolved.  She also gives a history of abdominal cramping for 5 days with loose to watery stool.  There is been no blood or pus in her stool.  No recent antibiotic exposure.  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  She is otherwise healthy and is 6 hours fasting.  Her parents are in their 52s.  Her mother has COPD, elevated cholesterol, hypertension and carotid artery disease.  Dad's health is questionable.  He drinks heavily and smokes.  Patient is recently adopted a more healthy lifestyle with regular exercise and healthy eating over the last 3 to 4 months.  Is been able to lose 14 pounds.  She lives with her husband and 2 children.  She does not smoke or use illicit drugs.  She drinks on occasion.  Well woman care is through her GYN doctor.  History Alison Stevens has a past medical history of H/O cold sores and SVD (spontaneous vaginal delivery).   She has a past surgical history that includes Wisdom tooth extraction and Dilation and evacuation (N/A, 08/31/2017).   Her family history is not on file.She reports that she has quit smoking. Her smoking use included cigarettes. She has a 1.00 pack-year smoking history. She has never used smokeless tobacco. She reports that she drinks alcohol. She reports that she does not use drugs.  No outpatient medications prior to visit.   No facility-administered medications prior to visit.     ROS Review of Systems  Constitutional: Negative for chills, diaphoresis, fatigue, fever and unexpected weight  change.  HENT: Positive for postnasal drip. Negative for rhinorrhea, sinus pressure, sinus pain, sneezing and sore throat.   Eyes: Positive for photophobia, pain, discharge and redness. Negative for visual disturbance.  Respiratory: Negative.   Cardiovascular: Negative.   Gastrointestinal: Positive for abdominal pain and diarrhea. Negative for blood in stool, constipation, nausea, rectal pain and vomiting.  Endocrine: Negative for polyphagia and polyuria.  Genitourinary: Negative.   Musculoskeletal: Negative for arthralgias and myalgias.  Skin: Negative for pallor.  Allergic/Immunologic: Negative for immunocompromised state.  Neurological: Positive for headaches. Negative for light-headedness and numbness.  Hematological: Does not bruise/bleed easily.  Psychiatric/Behavioral: Negative.     Objective:  BP 110/70 (BP Location: Left Arm, Patient Position: Sitting, Cuff Size: Normal)   Pulse 70   Temp 98.1 F (36.7 C) (Oral)   Ht 5\' 6"  (1.676 m)   Wt 127 lb 6 oz (57.8 kg)   LMP 06/29/2017   SpO2 97%   Breastfeeding? Unknown   BMI 20.56 kg/m   Physical Exam  Constitutional: She is oriented to person, place, and time. She appears well-developed and well-nourished. No distress.  HENT:  Head: Normocephalic and atraumatic.  Right Ear: External ear normal.  Left Ear: External ear normal.  Mouth/Throat: Oropharynx is clear and moist. No oropharyngeal exudate.  Eyes: EOM are normal. Right eye exhibits discharge. Left eye exhibits no discharge. No scleral icterus.    Neck: Neck  supple. No JVD present. No tracheal deviation present. No thyromegaly present.  Cardiovascular: Normal rate, regular rhythm and normal heart sounds.  Pulmonary/Chest: Effort normal and breath sounds normal.  Abdominal: Soft. Bowel sounds are normal. She exhibits no distension and no mass. There is no tenderness. There is no rebound and no guarding.  Neurological: She is alert and oriented to person, place, and  time.  Skin: Skin is warm and dry. She is not diaphoretic.  Psychiatric: She has a normal mood and affect. Her behavior is normal.      Assessment & Plan:   Diagnoses and all orders for this visit:  Iritis of right eye -     Ambulatory referral to Ophthalmology  Diarrhea of presumed infectious origin -     Stool, WBC/Lactoferrin; Future -     Stool Culture; Future  Anisocoria -     Ambulatory referral to Ophthalmology  Encounter for health maintenance examination with abnormal findings -     CBC; Future -     Comprehensive metabolic panel; Future -     LDL cholesterol, direct; Future -     Lipid panel; Future -     Urinalysis, Routine w reflex microscopic; Future -     VITAMIN D 25 Hydroxy (Vit-D Deficiency, Fractures) -     Urinalysis, Routine w reflex microscopic -     Lipid panel -     LDL cholesterol, direct -     Comprehensive metabolic panel -     CBC   Alison Stevens does not currently have medications on file.  No orders of the defined types were placed in this encounter.  Patient to see ophthalmology today for evaluation for possible iritis.  She will collect her stool specimen tonight and bring it back in the morning.  Labs are drawn today as she is essentially 6 hours fasting.  Follow-up: Return suggested follow up will pend results of blood work. Alison Sax, MD

## 2018-11-05 ENCOUNTER — Telehealth: Payer: Self-pay | Admitting: Family Medicine

## 2018-11-05 NOTE — Telephone Encounter (Signed)
Let's try a WebEx.

## 2018-11-05 NOTE — Telephone Encounter (Signed)
I spoke with patient. webex scheduled for 10:30 am tomorrow morning.

## 2018-11-05 NOTE — Telephone Encounter (Signed)
Okay for telephone visit or web ex?

## 2018-11-05 NOTE — Telephone Encounter (Signed)
Patient called and stated she does not want to come into the office for a appointment but needs Xanax called in for her anxiety. Patient states that she sees Dr. Doreene Burke but no CPE with provider in chart and seems to have only been seen once. Please advise patient

## 2018-11-06 ENCOUNTER — Telehealth (INDEPENDENT_AMBULATORY_CARE_PROVIDER_SITE_OTHER): Payer: BC Managed Care – PPO | Admitting: Family Medicine

## 2018-11-06 DIAGNOSIS — F4322 Adjustment disorder with anxiety: Secondary | ICD-10-CM | POA: Diagnosis not present

## 2018-11-06 MED ORDER — ALPRAZOLAM 0.5 MG PO TABS: 0.5000 mg | ORAL_TABLET | Freq: Two times a day (BID) | ORAL | 0 refills | Status: DC | PRN

## 2018-11-06 NOTE — Progress Notes (Signed)
Virtual Visit via Video Note  I connected with Alison Stevens on 11/06/18 at 10:30 AM EDT by a video enabled telemedicine application and verified that I am speaking with the correct person using two identifiers.   I discussed the limitations of evaluation and management by telemedicine and the availability of in person appointments. The patient expressed understanding and agreed to proceed.  History of Present Illness: As above patient presented by tele-med for treatment and evaluation of anxiety.  She has been feeling extremely anxious over the last week or so.  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  Her husband works in Consulting civil engineer and is fortunately been able to be home with her to some extent.  Patient has felt anxiety and loss of appetite.  There is been some sadness over the last few days that she thinks is due to the weather.  She was treated for anxiety 10 years ago briefly and recovered without issue.  She is active physically but has been unable to exercise outside due to the weather.  She does not smoke or use illicit drugs.  She enjoys a couple of cocktails once a month while watching comedy on Netflix.    Observations/Objective: Video observation of patient shows her to be appropriate and in no acute distress.   Assessment and Plan: 1. Acute adjustment disorder with anxiety  - ALPRAZolam (XANAX) 0.5 MG tablet; Take 1 tablet (0.5 mg total) by mouth 2 (two) times daily as needed for anxiety. Drowsy precautions.  Dispense: 30 tablet; Refill: 0  Follow Up Instructions:    I discussed the assessment and treatment plan with the patient. The patient was provided an opportunity to ask questions and all were answered. The patient agreed with the plan and demonstrated an understanding of the instructions.   The patient was advised to call back or seek an in-person evaluation if the symptoms worsen or if the condition fails to improve as  anticipated. Patient agrees to use alprazolam sparingly and follow-up in a month or 2 if she is not improving.  I provided 15 minutes of non-face-to-face time during this encounter.   Mliss Sax, MD

## 2018-12-10 ENCOUNTER — Other Ambulatory Visit: Payer: Self-pay | Admitting: Family Medicine

## 2018-12-10 ENCOUNTER — Ambulatory Visit: Payer: Self-pay | Admitting: *Deleted

## 2018-12-10 DIAGNOSIS — F4322 Adjustment disorder with anxiety: Secondary | ICD-10-CM

## 2018-12-10 NOTE — Telephone Encounter (Signed)
Pt called to inquire about antibody testing of Covid-19. Advised not currently available. Given Health Dept number for further information. Answer Assessment - Initial Assessment Questions 1. REASON FOR CALL or QUESTION: "What is your reason for calling today?" or "How can I best help you?" or "What question do you have that I can help answer?"     Antibody testing  Protocols used: INFORMATION ONLY CALL-A-AH

## 2018-12-11 ENCOUNTER — Encounter: Payer: Self-pay | Admitting: Family Medicine

## 2018-12-11 DIAGNOSIS — Z8619 Personal history of other infectious and parasitic diseases: Secondary | ICD-10-CM

## 2018-12-17 ENCOUNTER — Telehealth: Payer: Self-pay | Admitting: Family Medicine

## 2018-12-17 DIAGNOSIS — F4322 Adjustment disorder with anxiety: Secondary | ICD-10-CM

## 2018-12-17 MED ORDER — ALPRAZOLAM 0.5 MG PO TABS: 0.5000 mg | ORAL_TABLET | Freq: Two times a day (BID) | ORAL | 0 refills | Status: DC | PRN

## 2018-12-17 NOTE — Telephone Encounter (Signed)
Patient does have an appointment on 5/14. She is in [place] taking care of some things for family and she forgot to bring her Rx with her. She is asking for a 3-4 day supply for while she is down there.

## 2018-12-17 NOTE — Telephone Encounter (Signed)
I left patient a voicemail letting her know a refill was sent to the pharmacy down in [facility] as requested.

## 2018-12-17 NOTE — Telephone Encounter (Signed)
I sent a refill down there for her.

## 2018-12-17 NOTE — Telephone Encounter (Signed)
Copied from CRM [id number]. Topic: Quick Communication - Rx Refill/Question >> Dec 17, 2018  8:29 AM Burchel, Abbi R wrote: Medication: ALPRAZolam Prudy Feeler) 0.5 MG tablet   Preferred Pharmacy:  [id number] - [place], [place] - [address]  [phone] (Phone) [phone] (Fax)   Pt states she is out of town and forgot her rx at home.  She would like to have a 3-4 day supply called in to the pharmacy near her. Please advise.

## 2018-12-17 NOTE — Telephone Encounter (Signed)
As I recall she has an appointment set for 5/14.

## 2018-12-27 ENCOUNTER — Ambulatory Visit (INDEPENDENT_AMBULATORY_CARE_PROVIDER_SITE_OTHER): Payer: BC Managed Care – PPO | Admitting: Family Medicine

## 2018-12-27 ENCOUNTER — Encounter: Payer: Self-pay | Admitting: Family Medicine

## 2018-12-27 VITALS — Ht 66.0 in

## 2018-12-27 DIAGNOSIS — F4322 Adjustment disorder with anxiety: Secondary | ICD-10-CM | POA: Insufficient documentation

## 2018-12-27 NOTE — Progress Notes (Signed)
Virtual Visit via Video Note  I connected with Alison Stevens on 12/27/18 at  2:00 PM EDT by a video enabled telemedicine application and verified that I am speaking with the correct person using two identifiers.  Location: Patient: home Provider:    I discussed the limitations of evaluation and management by telemedicine and the availability of in person appointments. The patient expressed understanding and agreed to proceed.  History of Present Illness:    Observations/Objective:   Assessment and Plan:   Follow Up Instructions:    I discussed the assessment and treatment plan with the patient. The patient was provided an opportunity to ask questions and all were answered. The patient agreed with the plan and demonstrated an understanding of the instructions.   The patient was advised to call back or seek an in-person evaluation if the symptoms worsen or if the condition fails to improve as anticipated.  I provided 15   Established Patient Office Visit  Subjective:  Patient ID: Alison Stevens, female    DOB: [birth year]  Age: 38 y.o. MRN: [id number]  CC:  Chief Complaint  Patient presents with  . Follow-up    HPI Alison Stevens presents for follow-up of her anxiety.  Patient's life continues to be disrupted by COVID.  Her children are home on a full-time basis it seems to take all of her time seem to their needs.  Patient's husband is working from home and he seems to be busier than I have ever with his job in Consulting civil engineer.  School will end at 1 June and she thinks that her routine will get back to normal.  She has been unable to exercise in the meantime as much as she would like.  She does have a stationary bike in the home but has been reluctant to walk the trails around [place] because of the congestion.  Past Medical History:  Diagnosis Date  . H/O cold sores   . SVD (spontaneous vaginal delivery)    x 2    Past Surgical History:  Procedure Laterality Date  .  DILATION AND EVACUATION N/A 08/31/2017   Procedure: DILATATION AND EVACUATION;  Surgeon: Harold Hedge, MD;  Location: [facility];  Service: Gynecology;  Laterality: N/A;  . WISDOM TOOTH EXTRACTION      History reviewed. No pertinent family history.  Social History   Socioeconomic History  . Marital status: Married    Spouse name: Not on file  . Number of children: Not on file  . Years of education: Not on file  . Highest education level: Not on file  Occupational History  . Not on file  Social Needs  . Financial resource strain: Not on file  . Food insecurity:    Worry: Not on file    Inability: Not on file  . Transportation needs:    Medical: Not on file    Non-medical: Not on file  Tobacco Use  . Smoking status: Former Smoker    Packs/day: 0.25    Years: 4.00    Pack years: 1.00    Types: Cigarettes  . Smokeless tobacco: Never Used  . Tobacco comment: Smoked in [employer] - 4 yrs  Substance and Sexual Activity  . Alcohol use: Yes    Comment: none with pregnancy  . Drug use: No  . Sexual activity: Yes    Birth control/protection: None    Comment: approx [redacted] wks gestation per patient 08/25/17  Lifestyle  . Physical activity:  Days per week: Not on file    Minutes per session: Not on file  . Stress: Not on file  Relationships  . Social connections:    Talks on phone: Not on file    Gets together: Not on file    Attends religious service: Not on file    Active member of club or organization: Not on file    Attends meetings of clubs or organizations: Not on file    Relationship status: Not on file  . Intimate partner violence:    Fear of current or ex partner: Not on file    Emotionally abused: Not on file    Physically abused: Not on file    Forced sexual activity: Not on file  Other Topics Concern  . Not on file  Social History Narrative  . Not on file    Outpatient Medications Prior to Visit  Medication Sig Dispense Refill  . ALPRAZolam  (XANAX) 0.5 MG tablet Take 1 tablet (0.5 mg total) by mouth 2 (two) times daily as needed (USE SPARINGLY). for anxiety 30 tablet 0   No facility-administered medications prior to visit.     Allergies  Allergen Reactions  . Vicodin [Hydrocodone-Acetaminophen] Nausea And Vomiting    ROS Review of Systems  Constitutional: Negative.   Respiratory: Negative.   Cardiovascular: Negative.   Gastrointestinal: Negative.   Psychiatric/Behavioral: The patient is nervous/anxious.       Objective:    Physical Exam  Constitutional: She is oriented to person, place, and time. She appears well-developed and well-nourished. No distress.  HENT:  Head: Normocephalic and atraumatic.  Right Ear: External ear normal.  Left Ear: External ear normal.  Pulmonary/Chest: Effort normal.  Neurological: She is alert and oriented to person, place, and time.  Skin: She is not diaphoretic.  Psychiatric: She has a normal mood and affect. Her behavior is normal.    Ht 5\' 6"  (1.676 m)   BMI 20.56 kg/m  Wt Readings from Last 3 Encounters:  06/21/18 127 lb 6 oz (57.8 kg)  02/23/12 162 lb 4 oz (73.6 kg)     Health Maintenance Due  Topic Date Due  . TETANUS/TDAP  10/13/1999  . PAP SMEAR-Modifier  10/12/2001    There are no preventive care reminders to display for this patient.  No results found for: TSH Lab Results  Component Value Date   WBC 7.3 06/21/2018   HGB 13.6 06/21/2018   HCT 40.1 06/21/2018   MCV 90.7 06/21/2018   PLT 268.0 06/21/2018   Lab Results  Component Value Date   NA 138 06/21/2018   K 4.4 06/21/2018   CO2 29 06/21/2018   GLUCOSE 95 06/21/2018   BUN 9 06/21/2018   CREATININE 0.69 06/21/2018   BILITOT 0.4 06/21/2018   ALKPHOS 48 06/21/2018   AST 14 06/21/2018   ALT 6 06/21/2018   PROT 7.9 06/21/2018   ALBUMIN 5.1 06/21/2018   CALCIUM 9.9 06/21/2018   GFR 101.37 06/21/2018   Lab Results  Component Value Date   CHOL 201 (H) 06/21/2018   Lab Results  Component  Value Date   HDL 57.80 06/21/2018   Lab Results  Component Value Date   LDLCALC 120 (H) 06/21/2018   Lab Results  Component Value Date   TRIG 117.0 06/21/2018   Lab Results  Component Value Date   CHOLHDL 3 06/21/2018   No results found for: HGBA1C    Assessment & Plan:   Problem List Items Addressed This Visit  Other   Adjustment disorder with anxiety - Primary      No orders of the defined types were placed in this encounter.   Follow-up: Return in about 1 month (around 01/27/2019).    Mliss SaxWilliam Alfred Kyliah Deanda, MD 15 minutes of non-face-to-face time during this encounter.   Patient will follow-up in a month.  Suggested that we may want to consider a maintenance medicine for her anxiety.  She is on a second refill of the Xanax.

## 2019-01-09 ENCOUNTER — Other Ambulatory Visit: Payer: Self-pay | Admitting: Family Medicine

## 2019-01-09 DIAGNOSIS — F4322 Adjustment disorder with anxiety: Secondary | ICD-10-CM

## 2019-01-11 MED ORDER — ALPRAZOLAM 0.5 MG PO TABS: 0.5000 mg | ORAL_TABLET | Freq: Two times a day (BID) | ORAL | 0 refills | Status: DC | PRN

## 2019-01-11 NOTE — Addendum Note (Signed)
Addended by: Andrez Grime on: 01/11/2019 01:20 PM   Modules accepted: Orders

## 2019-01-11 NOTE — Telephone Encounter (Signed)
Checked PMP, last filled 12/17/2018, pt is due for refill.

## 2019-01-11 NOTE — Telephone Encounter (Signed)
Patient is calling in stating she would need this medication before Wednesday as she is going out of town. Please advise and call back [phone].

## 2019-01-11 NOTE — Telephone Encounter (Signed)
I left a voicemail for patient letting her know the Rx was sent & should be ready soon.

## 2019-02-12 ENCOUNTER — Other Ambulatory Visit: Payer: Self-pay | Admitting: Family Medicine

## 2019-02-12 DIAGNOSIS — F4322 Adjustment disorder with anxiety: Secondary | ICD-10-CM

## 2019-02-13 ENCOUNTER — Encounter: Payer: Self-pay | Admitting: Family Medicine

## 2019-02-13 NOTE — Telephone Encounter (Signed)
Needs ov

## 2019-02-14 NOTE — Telephone Encounter (Signed)
Message sent to [facility] for visit to be re-run under correct insurance.

## 2019-02-20 ENCOUNTER — Encounter: Payer: Self-pay | Admitting: Family Medicine

## 2019-02-20 ENCOUNTER — Telehealth (INDEPENDENT_AMBULATORY_CARE_PROVIDER_SITE_OTHER): Payer: BC Managed Care – PPO | Admitting: Family Medicine

## 2019-02-20 DIAGNOSIS — F4322 Adjustment disorder with anxiety: Secondary | ICD-10-CM | POA: Diagnosis not present

## 2019-02-20 NOTE — Progress Notes (Signed)
Established Patient Office Visit  Subjective:  Patient ID: Alison Stevens, female    DOB: [birth year]  Age: 38 y.o. MRN: [id number]  CC:  Chief Complaint  Patient presents with  . Follow-up    HPI Alison BirchwoodJessica A Stevens presents for fu of her anxiety. Expressed my concern about ongoing regular xanax usage. She has had multiple stressful events in the last months. She had tried a few antidepressants in the past, including Paxil, with side effect. They had made her jittery. She feels as though she is feeling much less anxious. She has been able to restart her exercise routing and get out of the house. 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 She does not feel depressed. She has had trouble taking meds on a daily basis in the past.   Past Medical History:  Diagnosis Date  . H/O cold sores   . SVD (spontaneous vaginal delivery)    x 2    Past Surgical History:  Procedure Laterality Date  . DILATION AND EVACUATION N/A 08/31/2017   Procedure: DILATATION AND EVACUATION;  Surgeon: Harold Hedgeomblin, James, MD;  [facility];  Service: Gynecology;  Laterality: N/A;  . WISDOM TOOTH EXTRACTION      History reviewed. No pertinent family history.  Social History   Socioeconomic History  . Marital status: Married    Spouse name: Not on file  . Number of children: Not on file  . Years of education: Not on file  . Highest education level: Not on file  Occupational History  . Not on file  Social Needs  . Financial resource strain: Not on file  . Food insecurity    Worry: Not on file    Inability: Not on file  . Transportation needs    Medical: Not on file    Non-medical: Not on file  Tobacco Use  . Smoking status: Former Smoker    Packs/day: 0.25    Years: 4.00    Pack years: 1.00    Types: Cigarettes  . Smokeless tobacco: Never Used  . Tobacco comment: Smoked in [employer] - 4 yrs  Substance and Sexual Activity  . Alcohol use: Yes    Comment: none with pregnancy  . Drug use: No  . Sexual activity: Yes    Birth control/protection: None    Comment: approx [redacted] wks gestation per patient 08/25/17  Lifestyle  . Physical activity    Days per week: Not on file    Minutes per session: Not on file  . Stress: Not on file  Relationships  . Social Musicianconnections    Talks on phone: Not on file    Gets together: Not on file    Attends religious service: Not on file    Active member of club or organization: Not on file    Attends meetings of clubs or organizations: Not on file    Relationship status: Not on file  . Intimate partner violence    Fear of current or ex partner: Not on file    Emotionally abused: Not on file    Physically abused: Not on file    Forced sexual activity: Not on file  Other Topics Concern  . Not on file  Social History Narrative  . Not on file    Outpatient Medications Prior to Visit  Medication Sig Dispense Refill  . ALPRAZolam Prudy Feeler(XANAX)  0.5 MG tablet TAKE 1 TABLET BY MOUTH TWICE DAILY AS NEEDED FOR ANXIETY 15 tablet 0   No facility-administered medications prior to visit.     Allergies  Allergen Reactions  . Vicodin [Hydrocodone-Acetaminophen] Nausea And Vomiting    ROS Review of Systems  Constitutional: Negative.   Respiratory: Negative.   Cardiovascular: Negative.   Gastrointestinal: Negative.   Psychiatric/Behavioral: Negative for behavioral problems and dysphoric mood. The patient is nervous/anxious.       Objective:    Physical Exam  Constitutional: She is oriented to person, place, and time. She appears well-developed and well-nourished. No distress.  HENT:  Head: Normocephalic and atraumatic.  Right Ear: External ear normal.  Left Ear: External ear normal.  Eyes: Conjunctivae are normal. Right eye exhibits no discharge. Left eye exhibits no discharge. No scleral icterus.  Neck: No tracheal deviation  present.  Pulmonary/Chest: Effort normal. No stridor.  Neurological: She is alert and oriented to person, place, and time.  Skin: She is not diaphoretic.  Psychiatric: She has a normal mood and affect. Her behavior is normal.    There were no vitals taken for this visit. Wt Readings from Last 3 Encounters:  06/21/18 127 lb 6 oz (57.8 kg)  02/23/12 162 lb 4 oz (73.6 kg)   BP Readings from Last 3 Encounters:  06/21/18 110/70  08/31/17 117/73  02/25/12 108/70   Guideline developer:  UpToDate (see UpToDate for funding source) Date Released: June 2014  Health Maintenance Due  Topic Date Due  . Janet BerlinETANUS/TDAP  10/13/1999  . PAP SMEAR-Modifier  10/12/2001    There are no preventive care reminders to display for this patient.  No results found for: TSH Lab Results  Component Value Date   WBC 7.3 06/21/2018   HGB 13.6 06/21/2018   HCT 40.1 06/21/2018   MCV 90.7 06/21/2018   PLT 268.0 06/21/2018   Lab Results  Component Value Date   NA 138 06/21/2018   K 4.4 06/21/2018   CO2 29 06/21/2018   GLUCOSE 95 06/21/2018   BUN 9 06/21/2018   CREATININE 0.69 06/21/2018   BILITOT 0.4 06/21/2018   ALKPHOS 48 06/21/2018   AST 14 06/21/2018   ALT 6 06/21/2018   PROT 7.9 06/21/2018   ALBUMIN 5.1 06/21/2018   CALCIUM 9.9 06/21/2018   GFR 101.37 06/21/2018   Lab Results  Component Value Date   CHOL 201 (H) 06/21/2018   Lab Results  Component Value Date   HDL 57.80 06/21/2018   Lab Results  Component Value Date   LDLCALC 120 (H) 06/21/2018   Lab Results  Component Value Date   TRIG 117.0 06/21/2018   Lab Results  Component Value Date   CHOLHDL 3 06/21/2018   No results found for: HGBA1C    Assessment & Plan:   Problem List Items Addressed This Visit      Other   Adjustment disorder with anxiety - Primary   Relevant Orders   Ambulatory referral to Psychology      No orders of the defined types were placed in this encounter.   Follow-up: Return in about 3  months (around 05/23/2019).   Patient believes that she will be using less Xanax over the next 3 months.  She agrees to pursue talking therapy preferably by soon.  We will see how it goes follow-up in 3 months  Virtual Visit via Video Note  I connected with Alison Stevens on 02/20/19 at 10:30 AM EDT by a video enabled telemedicine application  and verified that I am speaking with the correct person using two identifiers.  Location: Patient: home Provider:    I discussed the limitations of evaluation and management by telemedicine and the availability of in person appointments. The patient expressed understanding and agreed to proceed.  History of Present Illness:    Observations/Objective:   Assessment and Plan:   Follow Up Instructions:    I discussed the assessment and treatment plan with the patient. The patient was provided an opportunity to ask questions and all were answered. The patient agreed with the plan and demonstrated an understanding of the instructions.   The patient was advised to call back or seek an in-person evaluation if the symptoms worsen or if the condition fails to improve as anticipated.  I provided 25 minutes of non-face-to-face time during this encounter.   Libby Maw, MD

## 2019-02-21 ENCOUNTER — Other Ambulatory Visit: Payer: Self-pay

## 2019-02-21 ENCOUNTER — Telehealth: Payer: Self-pay | Admitting: *Deleted

## 2019-02-21 DIAGNOSIS — Z20822 Contact with and (suspected) exposure to covid-19: Secondary | ICD-10-CM

## 2019-02-21 NOTE — Telephone Encounter (Signed)
Message Received: Today Message Contents  Rodrigo Ran, CMA  [facility] Testing Pool        Pt is going out of state to [place] to see her parents, per pcp, okay for testing   Pt called and scheduled for testing on 02/21/19 at [facility] site. Pt advised to wear a mask and to remain in car at scheduled appt time. Pt verbalized understanding.

## 2019-02-25 LAB — NOVEL CORONAVIRUS, NAA: SARS-CoV-2, NAA: NOT DETECTED

## 2019-03-14 ENCOUNTER — Ambulatory Visit: Payer: BC Managed Care – PPO | Admitting: Licensed Clinical Social Worker

## 2019-03-20 ENCOUNTER — Other Ambulatory Visit: Payer: Self-pay | Admitting: Family Medicine

## 2019-03-20 DIAGNOSIS — F4322 Adjustment disorder with anxiety: Secondary | ICD-10-CM

## 2019-03-20 NOTE — Telephone Encounter (Signed)
WK-Plz see pt req for refill/LOV: 7.8.20/NOV due in October/Per [place] PMP pt is compliant without red flags/thx dmf

## 2019-03-25 MED ORDER — ALPRAZOLAM 0.5 MG PO TABS: 0.5000 mg | ORAL_TABLET | Freq: Two times a day (BID) | ORAL | 0 refills | Status: DC | PRN

## 2019-04-14 ENCOUNTER — Other Ambulatory Visit: Payer: Self-pay | Admitting: Family Medicine

## 2019-04-14 DIAGNOSIS — F4322 Adjustment disorder with anxiety: Secondary | ICD-10-CM

## 2019-04-16 ENCOUNTER — Other Ambulatory Visit: Payer: Self-pay | Admitting: Family Medicine

## 2019-04-16 ENCOUNTER — Encounter: Payer: Self-pay | Admitting: Family Medicine

## 2019-04-16 DIAGNOSIS — F4322 Adjustment disorder with anxiety: Secondary | ICD-10-CM

## 2019-05-20 ENCOUNTER — Other Ambulatory Visit: Payer: Self-pay | Admitting: Family Medicine

## 2019-05-20 DIAGNOSIS — F4322 Adjustment disorder with anxiety: Secondary | ICD-10-CM

## 2019-05-20 MED ORDER — ALPRAZOLAM 0.5 MG PO TABS: ORAL_TABLET | ORAL | 0 refills | Status: DC

## 2019-06-17 ENCOUNTER — Other Ambulatory Visit: Payer: Self-pay | Admitting: Family Medicine

## 2019-06-17 DIAGNOSIS — F4322 Adjustment disorder with anxiety: Secondary | ICD-10-CM

## 2019-06-17 MED ORDER — ALPRAZOLAM 0.5 MG PO TABS: ORAL_TABLET | ORAL | 0 refills | Status: DC

## 2019-06-17 NOTE — Telephone Encounter (Signed)
Needs ov

## 2019-07-17 ENCOUNTER — Other Ambulatory Visit: Payer: Self-pay | Admitting: Family Medicine

## 2019-07-17 DIAGNOSIS — F4322 Adjustment disorder with anxiety: Secondary | ICD-10-CM

## 2019-07-17 MED ORDER — ALPRAZOLAM 0.5 MG PO TABS: ORAL_TABLET | ORAL | 0 refills | Status: DC

## 2019-07-30 DIAGNOSIS — L7211 Pilar cyst: Secondary | ICD-10-CM | POA: Diagnosis not present

## 2019-07-30 DIAGNOSIS — L700 Acne vulgaris: Secondary | ICD-10-CM | POA: Diagnosis not present

## 2019-07-30 DIAGNOSIS — L400 Psoriasis vulgaris: Secondary | ICD-10-CM | POA: Diagnosis not present

## 2019-08-19 ENCOUNTER — Other Ambulatory Visit: Payer: Self-pay | Admitting: Family Medicine

## 2019-08-19 DIAGNOSIS — F4322 Adjustment disorder with anxiety: Secondary | ICD-10-CM

## 2019-08-19 MED ORDER — ALPRAZOLAM 0.5 MG PO TABS: ORAL_TABLET | ORAL | 0 refills | Status: DC

## 2019-08-19 NOTE — Telephone Encounter (Signed)
Mychart message sent to pt informing her that refill was sent in as she sent in another medication refill request.

## 2019-09-11 ENCOUNTER — Encounter: Payer: Self-pay | Admitting: Family Medicine

## 2019-09-12 ENCOUNTER — Encounter: Payer: Self-pay | Admitting: Family Medicine

## 2019-09-13 ENCOUNTER — Telehealth (INDEPENDENT_AMBULATORY_CARE_PROVIDER_SITE_OTHER): Payer: BC Managed Care – PPO | Admitting: Family Medicine

## 2019-09-13 ENCOUNTER — Encounter: Payer: Self-pay | Admitting: Family Medicine

## 2019-09-13 VITALS — Temp 98.5°F | Ht 66.0 in | Wt 132.2 lb

## 2019-09-13 DIAGNOSIS — R0789 Other chest pain: Secondary | ICD-10-CM | POA: Diagnosis not present

## 2019-09-13 NOTE — Telephone Encounter (Signed)
Spoke with patient schedule to come in for evaluation today.

## 2019-09-13 NOTE — Progress Notes (Signed)
Virtual Visit via Video Note  I connected with Alison Stevens on 09/13/19 at  3:30 PM EST by a video enabled telemedicine application and verified that I am speaking with the correct person using two identifiers. Location patient: home Location provider: work Persons participating in the virtual visit: patient, provider  I discussed the limitations of evaluation and management by telemedicine and the availability of in person appointments. The patient expressed understanding and agreed to proceed.  Chief Complaint  Patient presents with  . Breast Pain    Pt c/o lt breast pain, feeling like a burning sensation,  then a dull pain near ribs and under arm pit.  Coming and going x 2week but has been constant for the last 5 days,  more than 3 days.     HPI: Alison Stevens is a 39 y.o. female  She complains of an internal "burning sensation", that is "usually a hot burn but last week it was a cool burn". Symptoms x 2 weeks and initially on/off. Symptoms beneath her Lt breast. She also notes a "soreness" under Lt rib/ Symptoms are constant but change in intensity.  No skin changes. No rash. No redness. No warmth.  No fever, chills. No diffuse myalgias.  No SOB, DOE. She does state she feels some chest tightness.  She notes some difficulty getting comfortable at night but states sometimes laying on her Lt side is more comfortable.  She states she "eats advil like it's candy" a few times per week for "some sort of inflammation". She takes 400mg  ibuprofen when she does take it.  She notes increased anxiety since 10/2018. She notes a remote cardiac hx as a child being told whatever condition she had meant she couldn't take any meds that would speed up her heart. She does not know more info than that. She feels she would like to see cardio for eval d/t above symptoms and for piece of mind.  Past Medical History:  Diagnosis Date  . H/O cold sores   . SVD (spontaneous vaginal delivery)    x 2     Past Surgical History:  Procedure Laterality Date  . DILATION AND EVACUATION N/A 08/31/2017   Procedure: DILATATION AND EVACUATION;  Surgeon: Everlene Farrier, MD;  Location: [facility];  Service: Gynecology;  Laterality: N/A;  . WISDOM TOOTH EXTRACTION      History reviewed. No pertinent family history.  Social History   Tobacco Use  . Smoking status: Former Smoker    Packs/day: 0.25    Years: 4.00    Pack years: 1.00    Types: Cigarettes  . Smokeless tobacco: Never Used  . Tobacco comment: Smoked in [employer] - 4 yrs  Substance Use Topics  . Alcohol use: Yes    Comment: none with pregnancy  . Drug use: No     Current Outpatient Medications:  .  ALPRAZolam (XANAX) 0.5 MG tablet, Take one tablet daily as needed for anxiety., Disp: 8 tablet, Rfl: 0  Allergies  Allergen Reactions  . Vicodin [Hydrocodone-Acetaminophen] Nausea And Vomiting    ROS: See pertinent positives and negatives per HPI.   EXAM:  VITALS per patient if applicable: Temp 56.4 F (36.9 C) (Temporal)   Ht 5\' 6"  (1.676 m)   Wt 132 lb 3.2 oz (60 kg)   LMP 09/10/2019   BMI 21.34 kg/m    GENERAL: alert, oriented, appears well and in no acute distress  HEENT: atraumatic, conjunctiva clear, no obvious abnormalities on inspection of external  nose and ears  NECK: normal movements of the head and neck  LUNGS: on inspection no signs of respiratory distress, breathing rate appears normal, no obvious gross SOB, gasping or wheezing, no conversational dyspnea  CV: no obvious cyanosis  MS: moves all visible extremities without noticeable abnormality  PSYCH/NEURO: pleasant and cooperative, speech and thought processing grossly intact   ASSESSMENT AND PLAN:  1. Atypical chest pain - I do not feel this is cardiac in nature, seems more MSK or possibly GI in nature. Recommended heating pad 2-3x/day, ibuprofen 600mg  q8 w/ food. Anxiety could also play a role in some of her symptoms (chest  tightness, tingling). She would like to see cardiology for eval as she also mentions an issue with her heart as a child (she knows no additional info about this) - Ambulatory referral to Cardiology - if cardio eval negative, would recommend in-person appt with PCP or myself    I discussed the assessment and treatment plan with the patient. The patient was provided an opportunity to ask questions and all were answered. The patient agreed with the plan and demonstrated an understanding of the instructions.   The patient was advised to call back or seek an in-person evaluation if the symptoms worsen or if the condition fails to improve as anticipated.   , DO

## 2019-09-16 ENCOUNTER — Other Ambulatory Visit: Payer: Self-pay | Admitting: Family Medicine

## 2019-09-16 ENCOUNTER — Ambulatory Visit: Payer: BC Managed Care – PPO | Admitting: Family Medicine

## 2019-09-16 DIAGNOSIS — F4322 Adjustment disorder with anxiety: Secondary | ICD-10-CM

## 2019-09-16 MED ORDER — ALPRAZOLAM 0.5 MG PO TABS: ORAL_TABLET | ORAL | 0 refills | Status: DC

## 2019-09-16 NOTE — Telephone Encounter (Signed)
Refill for Alprazolam (xanax) 0.5mg  last OV was 09/13/19 with Dr. Salena Saner for chest pains last OV with you July 2020 for anxiety.

## 2019-10-03 ENCOUNTER — Ambulatory Visit: Payer: BC Managed Care – PPO | Admitting: Cardiology

## 2019-10-08 ENCOUNTER — Ambulatory Visit: Payer: BC Managed Care – PPO | Admitting: Cardiology

## 2019-10-11 ENCOUNTER — Other Ambulatory Visit: Payer: Self-pay | Admitting: Family Medicine

## 2019-10-11 DIAGNOSIS — F4322 Adjustment disorder with anxiety: Secondary | ICD-10-CM

## 2019-10-11 MED ORDER — ALPRAZOLAM 0.5 MG PO TABS: ORAL_TABLET | ORAL | 0 refills | Status: DC

## 2019-10-11 NOTE — Telephone Encounter (Signed)
Last VV 09/13/19 Last fill 09/16/19  #8/0

## 2019-10-18 ENCOUNTER — Other Ambulatory Visit: Payer: Self-pay

## 2019-10-18 ENCOUNTER — Ambulatory Visit (INDEPENDENT_AMBULATORY_CARE_PROVIDER_SITE_OTHER): Payer: BC Managed Care – PPO | Admitting: Cardiology

## 2019-10-18 ENCOUNTER — Encounter: Payer: Self-pay | Admitting: Cardiology

## 2019-10-18 VITALS — BP 110/72 | HR 82 | Ht 66.0 in | Wt 136.0 lb

## 2019-10-18 DIAGNOSIS — Z7189 Other specified counseling: Secondary | ICD-10-CM

## 2019-10-18 DIAGNOSIS — R079 Chest pain, unspecified: Secondary | ICD-10-CM | POA: Diagnosis not present

## 2019-10-18 DIAGNOSIS — M940 Chondrocostal junction syndrome [Tietze]: Secondary | ICD-10-CM | POA: Diagnosis not present

## 2019-10-18 NOTE — Progress Notes (Signed)
Cardiology Office Note:    Date:  10/18/2019   ID:  Adolphus Birchwood, DOB Jul 22, 1981, MRN [id number]  PCP:  Mliss Sax, MD  Cardiologist:  [name], MD  Referring MD: Overton Mam, DO   CC: new patient consultation for chest pain  History of Present Illness:    Alison Stevens is a 39 y.o. female with a hx of anxiety who is seen as a new consult at the request of Overton Mam, DO for the evaluation and management of chest pain.  Note dated 09/13/19 from Dr. Barron Alvine reviewed. Noted to have a burning sensation intermittently for two weeks under her left breast. She also noted soreness around her left ribs that was constant. Takes frequent ibuprofen for pain.  Today: Chest pain: -Initial onset: first noticed about 6 weeks ago.  -Quality: left breast area or below left lateral rib cage, rarely on the right. Sharp pain. Relentless when it happens. Has decreased in intensity. Tender to the touch. -Frequency: Notices more at night, nearly every night -Duration: comes and goes, difficult to say -Associated symptoms: feels like she can't get a full breath in -Aggravating/alleviating factors: better when she lays on her right side -Prior cardiac history: when she was [age], had a sports physical, told she had something with her heart, told not to take anything that would speed up her heart -Prior ECG: NSR -Prior workup: none -Prior treatment: none -Alcohol: had been a few drinks/day, in the last new weeks has cut back to just Friday nights, 4-5 drinks over the night -Tobacco: former smoker -Comorbidities: anxiety -Exercise level: just started back with exercise last week. Hadn't exercised much since 04/2019.  -Cardiac ROS: no shortness of breath, no PND, no orthopnea, no LE edema, no recent syncope (passes out/near syncope with panic attacks, none since late summer 2020) -Family history: mat grandmother died of heart disease at age 18, great  grandmother with a stroke. No other CV disease that she knows of.   Past Medical History:  Diagnosis Date  . H/O cold sores   . SVD (spontaneous vaginal delivery)    x 2    Past Surgical History:  Procedure Laterality Date  . DILATION AND EVACUATION N/A 08/31/2017   Procedure: DILATATION AND EVACUATION;  Surgeon: Harold Hedge, MD;  Location: [facility];  Service: Gynecology;  Laterality: N/A;  . WISDOM TOOTH EXTRACTION      Current Medications: Current Outpatient Medications on File Prior to Visit  Medication Sig  . ALPRAZolam (XANAX) 0.5 MG tablet Take one tablet daily as needed for anxiety.  . Doxylamine Succinate, Sleep, (UNISOM PO) Take 25 mg by mouth at bedtime.  . Ibuprofen (ADVIL PO) Take by mouth.  . OMEPRAZOLE PO Take by mouth.   No current facility-administered medications on file prior to visit.     Allergies:   Vicodin [hydrocodone-acetaminophen]   Social History   Tobacco Use  . Smoking status: Former Smoker    Packs/day: 0.25    Years: 4.00    Pack years: 1.00    Types: Cigarettes  . Smokeless tobacco: Never Used  . Tobacco comment: Smoked in [employer] - 4 yrs  Substance Use Topics  . Alcohol use: Yes    Comment: none with pregnancy  . Drug use: No    Family History: family history is not on file.  ROS:   Please see the history of present illness.  Additional pertinent ROS: Constitutional: Negative for chills, fever, night sweats, unintentional weight  loss  HENT: Negative for ear pain and hearing loss.   Eyes: Negative for loss of vision and eye pain.  Respiratory: Negative for cough, sputum, wheezing.   Cardiovascular: See HPI. Gastrointestinal: Negative for abdominal pain, melena, and hematochezia.  Genitourinary: Negative for dysuria and hematuria.  Musculoskeletal: Negative for falls and myalgias.  Skin: Negative for itching and rash.  Neurological: Negative for focal weakness, focal sensory changes and loss of consciousness.   Endo/Heme/Allergies: Does not bruise/bleed easily.     EKGs/Labs/Other Studies Reviewed:    The following studies were reviewed today: No prior cardiac studies  EKG:  EKG is personally reviewed.  The ekg ordered today demonstrates NSR  Recent Labs: No results found for requested labs within last 8760 hours.  Recent Lipid Panel    Component Value Date/Time   CHOL 201 (H) 06/21/2018 1353   TRIG 117.0 06/21/2018 1353   HDL 57.80 06/21/2018 1353   CHOLHDL 3 06/21/2018 1353   VLDL 23.4 06/21/2018 1353   LDLCALC 120 (H) 06/21/2018 1353   LDLDIRECT 134.0 06/21/2018 1353    Physical Exam:    VS:  BP 110/72   Pulse 82   Ht 5\' 6"  (1.676 m)   Wt 136 lb (61.7 kg)   BMI 21.95 kg/m     Wt Readings from Last 3 Encounters:  10/18/19 136 lb (61.7 kg)  09/13/19 132 lb 3.2 oz (60 kg)  06/21/18 127 lb 6 oz (57.8 kg)    GEN: Well nourished, well developed in no acute distress HEENT: Normal, moist mucous membranes NECK: No JVD CARDIAC: regular rhythm, normal S1 and S2, no rubs or gallops. No murmurs. CHEST: tender on palpation across multiple rib spaces, most notable right parasternal, left parasternal, and left midaxillary at the ribs. VASCULAR: Radial and DP pulses 2+ bilaterally. No carotid bruits RESPIRATORY:  Clear to auscultation without rales, wheezing or rhonchi  ABDOMEN: Soft, non-tender, non-distended MUSCULOSKELETAL:  Ambulates independently SKIN: Warm and dry, no edema NEUROLOGIC:  Alert and oriented x 3. No focal neuro deficits noted. PSYCHIATRIC:  Normal affect    ASSESSMENT:    1. Chest pain, unspecified type   2. Costochondritis   3. Cardiac risk counseling   4. Counseling on health promotion and disease prevention    PLAN:    Chest pain: atypical for cardiac etiology and no other risk factors. -normal ECG -with tenderness to palpation, likely costochondritis. Counseled on how to manage  Cardiac risk counseling and prevention recommendations: -recommend  heart healthy/Mediterranean diet, with whole grains, fruits, vegetable, fish, lean meats, nuts, and olive oil. Limit salt. -recommend moderate walking, 3-5 times/week for 30-50 minutes each session. Aim for at least 150 minutes.week. Goal should be pace of 3 miles/hours, or walking 1.5 miles in 30 minutes -recommend avoidance of tobacco products. Avoid excess alcohol.  Plan for follow up: as needed  Buford Dresser, MD, PhD   [facility]    Medication Adjustments/Labs and Tests Ordered: Current medicines are reviewed at length with the patient today.  Concerns regarding medicines are outlined above.  Orders Placed This Encounter  Procedures  . EKG 12-Lead   No orders of the defined types were placed in this encounter.   Patient Instructions  Medication Instructions:  Your Physician recommend you continue on your current medication as directed.    *If you need a refill on your cardiac medications before your next appointment, please call your pharmacy*   Lab Work: None  Testing/Procedures: None   Follow-Up: At [facility],  you and your health needs are our priority.  As part of our continuing mission to provide you with exceptional heart care, we have created designated Provider Care Teams.  These Care Teams include your primary Cardiologist (physician) and Advanced Practice Providers (APPs -  Physician Assistants and Nurse Practitioners) who all work together to provide you with the care you need, when you need it.  We recommend signing up for the patient portal called "MyChart".  Sign up information is provided on this After Visit Summary.  MyChart is used to connect with patients for Virtual Visits (Telemedicine).  Patients are able to view lab/test results, encounter notes, upcoming appointments, etc.  Non-urgent messages can be sent to your provider as well.   To learn more about what you can do with MyChart, go to [email or web address].    Your  next appointment:   As needed  The format for your next appointment:   Either In Person or Virtual  Provider:   [name], MD      Signed, [name], MD PhD 10/18/2019 5:10 PM    [facility]

## 2019-10-18 NOTE — Patient Instructions (Signed)
Medication Instructions:  Your Physician recommend you continue on your current medication as directed.    *If you need a refill on your cardiac medications before your next appointment, please call your pharmacy*   Lab Work: None   Testing/Procedures: None   Follow-Up: At [facility], you and your health needs are our priority.  As part of our continuing mission to provide you with exceptional heart care, we have created designated Provider Care Teams.  These Care Teams include your primary Cardiologist (physician) and Advanced Practice Providers (APPs -  Physician Assistants and Nurse Practitioners) who all work together to provide you with the care you need, when you need it.  We recommend signing up for the patient portal called "MyChart".  Sign up information is provided on this After Visit Summary.  MyChart is used to connect with patients for Virtual Visits (Telemedicine).  Patients are able to view lab/test results, encounter notes, upcoming appointments, etc.  Non-urgent messages can be sent to your provider as well.   To learn more about what you can do with MyChart, go to [email or web address].    Your next appointment:   As needed  The format for your next appointment:   Either In Person or Virtual  Provider:   Bridgette Christopher, MD   

## 2019-10-25 ENCOUNTER — Encounter: Payer: Self-pay | Admitting: Family Medicine

## 2019-10-28 ENCOUNTER — Ambulatory Visit: Payer: BC Managed Care – PPO | Attending: Internal Medicine

## 2019-10-28 DIAGNOSIS — Z20822 Contact with and (suspected) exposure to covid-19: Secondary | ICD-10-CM

## 2019-10-29 LAB — NOVEL CORONAVIRUS, NAA: SARS-CoV-2, NAA: NOT DETECTED

## 2019-10-31 ENCOUNTER — Encounter: Payer: Self-pay | Admitting: Family Medicine

## 2019-11-07 ENCOUNTER — Other Ambulatory Visit: Payer: Self-pay | Admitting: Family Medicine

## 2019-11-07 DIAGNOSIS — F4322 Adjustment disorder with anxiety: Secondary | ICD-10-CM

## 2019-11-07 NOTE — Telephone Encounter (Signed)
Patient informed that she must schedule appointment to get RX refill. Patient declined to schedule appointment and stated that she will send Dr. Doreene Burke a message via [place], since he will work with her and give her the refill on meds. Patient advised due to patient being last seen in July she needs an appointment before refill with be sent in.

## 2019-11-08 MED ORDER — ALPRAZOLAM 0.5 MG PO TABS: ORAL_TABLET | ORAL | 0 refills | Status: DC

## 2019-11-08 NOTE — Telephone Encounter (Signed)
Attempted to call patient to set appt. LVM for patient to call us for appt with Dr. Doreene Burke.

## 2019-11-08 NOTE — Telephone Encounter (Signed)
Spoke with patient who states that she will be leaving to go out of town tomorrow would like to know if she could have Rx sent in for anxiety. Appointment scheduled for follow up when she gets back in town. Please advise.

## 2019-11-08 NOTE — Telephone Encounter (Signed)
Dr. Doreene Burke please advise, pt requesting some refill send in since she is traveling next week?   I left vm for the pt to call back to schedule an appt with Dr. Doreene Burke.   Admin team please help follow up on this since pt stated she couldn't get through to [place] to make an appt.

## 2019-11-21 ENCOUNTER — Ambulatory Visit: Payer: BC Managed Care – PPO | Admitting: Family Medicine

## 2019-11-26 ENCOUNTER — Other Ambulatory Visit: Payer: Self-pay

## 2019-11-26 ENCOUNTER — Ambulatory Visit (INDEPENDENT_AMBULATORY_CARE_PROVIDER_SITE_OTHER): Payer: BC Managed Care – PPO

## 2019-11-26 ENCOUNTER — Ambulatory Visit (INDEPENDENT_AMBULATORY_CARE_PROVIDER_SITE_OTHER): Payer: BC Managed Care – PPO | Admitting: Family Medicine

## 2019-11-26 ENCOUNTER — Encounter: Payer: Self-pay | Admitting: Family Medicine

## 2019-11-26 VITALS — BP 118/64 | HR 92 | Temp 98.1°F | Ht 66.0 in | Wt 127.6 lb

## 2019-11-26 DIAGNOSIS — M940 Chondrocostal junction syndrome [Tietze]: Secondary | ICD-10-CM

## 2019-11-26 DIAGNOSIS — Z0000 Encounter for general adult medical examination without abnormal findings: Secondary | ICD-10-CM | POA: Diagnosis not present

## 2019-11-26 DIAGNOSIS — F4322 Adjustment disorder with anxiety: Secondary | ICD-10-CM | POA: Diagnosis not present

## 2019-11-26 DIAGNOSIS — F5104 Psychophysiologic insomnia: Secondary | ICD-10-CM

## 2019-11-26 DIAGNOSIS — R0789 Other chest pain: Secondary | ICD-10-CM | POA: Insufficient documentation

## 2019-11-26 DIAGNOSIS — R079 Chest pain, unspecified: Secondary | ICD-10-CM | POA: Diagnosis not present

## 2019-11-26 LAB — COMPREHENSIVE METABOLIC PANEL
ALT: 4 U/L (ref 0–35)
AST: 14 U/L (ref 0–37)
Albumin: 4.8 g/dL (ref 3.5–5.2)
Alkaline Phosphatase: 46 U/L (ref 39–117)
BUN: 11 mg/dL (ref 6–23)
CO2: 26 mEq/L (ref 19–32)
Calcium: 9.4 mg/dL (ref 8.4–10.5)
Chloride: 104 mEq/L (ref 96–112)
Creatinine, Ser: 0.71 mg/dL (ref 0.40–1.20)
GFR: 91.59 mL/min (ref >60.00)
Glucose, Bld: 91 mg/dL (ref 70–99)
Potassium: 4 mEq/L (ref 3.5–5.1)
Sodium: 137 mEq/L (ref 135–145)
Total Bilirubin: 0.6 mg/dL (ref 0.2–1.2)
Total Protein: 7.8 g/dL (ref 6.0–8.3)

## 2019-11-26 LAB — URINALYSIS, ROUTINE W REFLEX MICROSCOPIC
Bilirubin Urine: NEGATIVE
Hgb urine dipstick: NEGATIVE
Ketones, ur: NEGATIVE
Leukocytes,Ua: NEGATIVE
Nitrite: NEGATIVE
RBC / HPF: NONE SEEN (ref 0–?)
Specific Gravity, Urine: 1.025 (ref 1.000–1.030)
Total Protein, Urine: NEGATIVE
Urine Glucose: NEGATIVE
Urobilinogen, UA: 0.2 (ref 0.0–1.0)
pH: 6 (ref 5.0–8.0)

## 2019-11-26 LAB — LIPID PANEL
Cholesterol: 217 mg/dL — ABNORMAL HIGH (ref 0–200)
HDL: 56.9 mg/dL (ref >39.00)
LDL Cholesterol: 139 mg/dL — ABNORMAL HIGH (ref 0–99)
NonHDL: 159.64
Total CHOL/HDL Ratio: 4
Triglycerides: 102 mg/dL (ref 0.0–149.0)
VLDL: 20.4 mg/dL (ref 0.0–40.0)

## 2019-11-26 LAB — CBC
HCT: 39 % (ref 36.0–46.0)
Hemoglobin: 13.2 g/dL (ref 12.0–15.0)
MCHC: 33.8 g/dL (ref 30.0–36.0)
MCV: 92.1 fl (ref 78.0–100.0)
Platelets: 248 10*3/uL (ref 150.0–400.0)
RBC: 4.24 Mil/uL (ref 3.87–5.11)
RDW: 12.5 % (ref 11.5–15.5)
WBC: 5.8 10*3/uL (ref 4.0–10.5)

## 2019-11-26 MED ORDER — TRAZODONE HCL 50 MG PO TABS: 25.0000 mg | ORAL_TABLET | Freq: Every evening | ORAL | 1 refills | Status: DC | PRN

## 2019-11-26 NOTE — Patient Instructions (Signed)
Health Maintenance, Female Adopting a healthy lifestyle and getting preventive care are important in promoting health and wellness. Ask your health care provider about:  The right schedule for you to have regular tests and exams.  Things you can do on your own to prevent diseases and keep yourself healthy. What should I know about diet, weight, and exercise? Eat a healthy diet   Eat a diet that includes plenty of vegetables, fruits, low-fat dairy products, and lean protein.  Do not eat a lot of foods that are high in solid fats, added sugars, or sodium. Maintain a healthy weight Body mass index (BMI) is used to identify weight problems. It estimates body fat based on height and weight. Your health care provider can help determine your BMI and help you achieve or maintain a healthy weight. Get regular exercise Get regular exercise. This is one of the most important things you can do for your health. Most adults should:  Exercise for at least 150 minutes each week. The exercise should increase your heart rate and make you sweat (moderate-intensity exercise).  Do strengthening exercises at least twice a week. This is in addition to the moderate-intensity exercise.  Spend less time sitting. Even light physical activity can be beneficial. Watch cholesterol and blood lipids Have your blood tested for lipids and cholesterol at 39 years of age, then have this test every 5 years. Have your cholesterol levels checked more often if:  Your lipid or cholesterol levels are high.  You are older than 39 years of age.  You are at high risk for heart disease. What should I know about cancer screening? Depending on your health history and family history, you may need to have cancer screening at various ages. This may include screening for:  Breast cancer.  Cervical cancer.  Colorectal cancer.  Skin cancer.  Lung cancer. What should I know about heart disease, diabetes, and high blood  pressure? Blood pressure and heart disease  High blood pressure causes heart disease and increases the risk of stroke. This is more likely to develop in people who have high blood pressure readings, are of African descent, or are overweight.  Have your blood pressure checked: ? Every 3-5 years if you are 47-19 years of age. ? Every year if you are 87 years old or older. Diabetes Have regular diabetes screenings. This checks your fasting blood sugar level. Have the screening done:  Once every three years after age 43 if you are at a normal weight and have a low risk for diabetes.  More often and at a younger age if you are overweight or have a high risk for diabetes. What should I know about preventing infection? Hepatitis B If you have a higher risk for hepatitis B, you should be screened for this virus. Talk with your health care provider to find out if you are at risk for hepatitis B infection. Hepatitis C Testing is recommended for:  Everyone born from [birth year] through 1965.  Anyone with known risk factors for hepatitis C. Sexually transmitted infections (STIs)  Get screened for STIs, including gonorrhea and chlamydia, if: ? You are sexually active and are younger than 39 years of age. ? You are older than 39 years of age and your health care provider tells you that you are at risk for this type of infection. ? Your sexual activity has changed since you were last screened, and you are at increased risk for chlamydia or gonorrhea. Ask your health care provider if  you are at risk.  Ask your health care provider about whether you are at high risk for HIV. Your health care provider may recommend a prescription medicine to help prevent HIV infection. If you choose to take medicine to prevent HIV, you should first get tested for HIV. You should then be tested every 3 months for as long as you are taking the medicine. Pregnancy  If you are about to stop having your period (premenopausal) and  you may become pregnant, seek counseling before you get pregnant.  Take 400 to 800 micrograms (mcg) of folic acid every day if you become pregnant.  Ask for birth control (contraception) if you want to prevent pregnancy. Osteoporosis and menopause Osteoporosis is a disease in which the bones lose minerals and strength with aging. This can result in bone fractures. If you are 44 years old or older, or if you are at risk for osteoporosis and fractures, ask your health care provider if you should:  Be screened for bone loss.  Take a calcium or vitamin D supplement to lower your risk of fractures.  Be given hormone replacement therapy (HRT) to treat symptoms of menopause. Follow these instructions at home: Lifestyle  Do not use any products that contain nicotine or tobacco, such as cigarettes, e-cigarettes, and chewing tobacco. If you need help quitting, ask your health care provider.  Do not use street drugs.  Do not share needles.  Ask your health care provider for help if you need support or information about quitting drugs. Alcohol use  Do not drink alcohol if: ? Your health care provider tells you not to drink. ? You are pregnant, may be pregnant, or are planning to become pregnant.  If you drink alcohol: ? Limit how much you use to 0-1 drink a day. ? Limit intake if you are breastfeeding.  Be aware of how much alcohol is in your drink. In the [place] one drink equals one 12 oz bottle of beer (355 mL), one 5 oz glass of wine (148 mL), or one 1 oz glass of hard liquor (44 mL). General instructions  Schedule regular health, dental, and eye exams.  Stay current with your vaccines.  Tell your health care provider if: ? You often feel depressed. ? You have ever been abused or do not feel safe at home. Summary  Adopting a healthy lifestyle and getting preventive care are important in promoting health and wellness.  Follow your health care provider's instructions about healthy  diet, exercising, and getting tested or screened for diseases.  Follow your health care provider's instructions on monitoring your cholesterol and blood pressure. This information is not intended to replace advice given to you by your health care provider. Make sure you discuss any questions you have with your health care provider. Document Revised: 07/25/2018 Document Reviewed: 07/25/2018 Elsevier Patient Education  [place], Adult After being diagnosed with an anxiety disorder, you may be relieved to know why you have felt or behaved a certain way. You may also feel overwhelmed about the treatment ahead and what it will mean for your life. With care and support, you can manage this condition and recover from it. How to manage lifestyle changes Managing stress and anxiety  Stress is your body's reaction to life changes and events, both good and bad. Most stress will last just a few hours, but stress can be ongoing and can lead to more than just stress. Although stress can play a major role in  anxiety, it is not the same as anxiety. Stress is usually caused by something external, such as a deadline, test, or competition. Stress normally passes after the triggering event has ended.  Anxiety is caused by something internal, such as imagining a terrible outcome or worrying that something will go wrong that will devastate you. Anxiety often does not go away even after the triggering event is over, and it can become long-term (chronic) worry. It is important to understand the differences between stress and anxiety and to manage your stress effectively so that it does not lead to an anxious response. Talk with your health care provider or a counselor to learn more about reducing anxiety and stress. He or she may suggest tension reduction techniques, such as:  Music therapy. This can include creating or listening to music that you enjoy and that inspires  you.  Mindfulness-based meditation. This involves being aware of your normal breaths while not trying to control your breathing. It can be done while sitting or walking.  Centering prayer. This involves focusing on a word, phrase, or sacred image that means something to you and brings you peace.  Deep breathing. To do this, expand your stomach and inhale slowly through your nose. Hold your breath for 3-5 seconds. Then exhale slowly, letting your stomach muscles relax.  Self-talk. This involves identifying thought patterns that lead to anxiety reactions and changing those patterns.  Muscle relaxation. This involves tensing muscles and then relaxing them. Choose a tension reduction technique that suits your lifestyle and personality. These techniques take time and practice. Set aside 5-15 minutes a day to do them. Therapists can offer counseling and training in these techniques. The training to help with anxiety may be covered by some insurance plans. Other things you can do to manage stress and anxiety include:  Keeping a stress/anxiety diary. This can help you learn what triggers your reaction and then learn ways to manage your response.  Thinking about how you react to certain situations. You may not be able to control everything, but you can control your response.  Making time for activities that help you relax and not feeling guilty about spending your time in this way.  Visual imagery and yoga can help you stay calm and relax.  Medicines Medicines can help ease symptoms. Medicines for anxiety include:  Anti-anxiety drugs.  Antidepressants. Medicines are often used as a primary treatment for anxiety disorder. Medicines will be prescribed by a health care provider. When used together, medicines, psychotherapy, and tension reduction techniques may be the most effective treatment. Relationships Relationships can play a big part in helping you recover. Try to spend more time connecting  with trusted friends and family members. Consider going to couples counseling, taking family education classes, or going to family therapy. Therapy can help you and others better understand your condition. How to recognize changes in your anxiety Everyone responds differently to treatment for anxiety. Recovery from anxiety happens when symptoms decrease and stop interfering with your daily activities at home or work. This may mean that you will start to:  Have better concentration and focus. Worry will interfere less in your daily thinking.  Sleep better.  Be less irritable.  Have more energy.  Have improved memory. It is important to recognize when your condition is getting worse. Contact your health care provider if your symptoms interfere with home or work and you feel like your condition is not improving. Follow these instructions at home: Activity  Exercise. Most adults should  do the following: ? Exercise for at least 150 minutes each week. The exercise should increase your heart rate and make you sweat (moderate-intensity exercise). ? Strengthening exercises at least twice a week.  Get the right amount and quality of sleep. Most adults need 7-9 hours of sleep each night. Lifestyle   Eat a healthy diet that includes plenty of vegetables, fruits, whole grains, low-fat dairy products, and lean protein. Do not eat a lot of foods that are high in solid fats, added sugars, or salt.  Make choices that simplify your life.  Do not use any products that contain nicotine or tobacco, such as cigarettes, e-cigarettes, and chewing tobacco. If you need help quitting, ask your health care provider.  Avoid caffeine, alcohol, and certain over-the-counter cold medicines. These may make you feel worse. Ask your pharmacist which medicines to avoid. General instructions  Take over-the-counter and prescription medicines only as told by your health care provider.  Keep all follow-up visits as told  by your health care provider. This is important. Where to find support You can get help and support from these sources:  Self-help groups.  Online and [employer].  A trusted spiritual leader.  Couples counseling.  Family education classes.  Family therapy. Where to find more information You may find that joining a support group helps you deal with your anxiety. The following sources can help you locate counselors or support groups near you:  [place]: www.mentalhealth[place].net  Anxiety and Depression Association of [place] (ADAA): https://www.clark.net/  National Alliance on Mental Illness (NAMI): www.nami.org Contact a health care provider if you:  Have a hard time staying focused or finishing daily tasks.  Spend many hours a day feeling worried about everyday life.  Become exhausted by worry.  Start to have headaches, feel tense, or have nausea.  Urinate more than normal.  Have diarrhea. Get help right away if you have:  A racing heart and shortness of breath.  Thoughts of hurting yourself or others. If you ever feel like you may hurt yourself or others, or have thoughts about taking your own life, get help right away. You can go to your nearest emergency department or call:  Your local emergency services (911 in the [place]  A suicide crisis helpline, such as the [place] at 862 485 9699. This is open 24 hours a day. Summary  Taking steps to learn and use tension reduction techniques can help calm you and help prevent triggering an anxiety reaction.  When used together, medicines, psychotherapy, and tension reduction techniques may be the most effective treatment.  Family, friends, and partners can play a big part in helping you recover from an anxiety disorder. This information is not intended to replace advice given to you by your health care provider. Make sure you discuss any questions you have with your health  care provider. Document Revised: 01/01/2019 Document Reviewed: 01/01/2019 Elsevier Patient Education  [place] 45-11 Years Old, Female Preventive care refers to visits with your health care provider and lifestyle choices that can promote health and wellness. This includes:  A yearly physical exam. This may also be called an annual well check.  Regular dental visits and eye exams.  Immunizations.  Screening for certain conditions.  Healthy lifestyle choices, such as eating a healthy diet, getting regular exercise, not using drugs or products that contain nicotine and tobacco, and limiting alcohol use. What can I expect for my preventive care visit? Physical exam Your health  care provider will check your:  Height and weight. This may be used to calculate body mass index (BMI), which tells if you are at a healthy weight.  Heart rate and blood pressure.  Skin for abnormal spots. Counseling Your health care provider may ask you questions about your:  Alcohol, tobacco, and drug use.  Emotional well-being.  Home and relationship well-being.  Sexual activity.  Eating habits.  Work and work Statistician.  Method of birth control.  Menstrual cycle.  Pregnancy history. What immunizations do I need?  Influenza (flu) vaccine  This is recommended every year. Tetanus, diphtheria, and pertussis (Tdap) vaccine  You may need a Td booster every 10 years. Varicella (chickenpox) vaccine  You may need this if you have not been vaccinated. Human papillomavirus (HPV) vaccine  If recommended by your health care provider, you may need three doses over 6 months. Measles, mumps, and rubella (MMR) vaccine  You may need at least one dose of MMR. You may also need a second dose. Meningococcal conjugate (MenACWY) vaccine  One dose is recommended if you are age 58-21 years and a first-year college student living in a residence hall, or if you have one of  several medical conditions. You may also need additional booster doses. Pneumococcal conjugate (PCV13) vaccine  You may need this if you have certain conditions and were not previously vaccinated. Pneumococcal polysaccharide (PPSV23) vaccine  You may need one or two doses if you smoke cigarettes or if you have certain conditions. Hepatitis A vaccine  You may need this if you have certain conditions or if you travel or work in places where you may be exposed to hepatitis A. Hepatitis B vaccine  You may need this if you have certain conditions or if you travel or work in places where you may be exposed to hepatitis B. Haemophilus influenzae type b (Hib) vaccine  You may need this if you have certain conditions. You may receive vaccines as individual doses or as more than one vaccine together in one shot (combination vaccines). Talk with your health care provider about the risks and benefits of combination vaccines. What tests do I need?  Blood tests  Lipid and cholesterol levels. These may be checked every 5 years starting at age 67.  Hepatitis C test.  Hepatitis B test. Screening  Diabetes screening. This is done by checking your blood sugar (glucose) after you have not eaten for a while (fasting).  Sexually transmitted disease (STD) testing.  BRCA-related cancer screening. This may be done if you have a family history of breast, ovarian, tubal, or peritoneal cancers.  Pelvic exam and Pap test. This may be done every 3 years starting at age 73. Starting at age 52, this may be done every 5 years if you have a Pap test in combination with an HPV test. Talk with your health care provider about your test results, treatment options, and if necessary, the need for more tests. Follow these instructions at home: Eating and drinking   Eat a diet that includes fresh fruits and vegetables, whole grains, lean protein, and low-fat dairy.  Take vitamin and mineral supplements as  recommended by your health care provider.  Do not drink alcohol if: ? Your health care provider tells you not to drink. ? You are pregnant, may be pregnant, or are planning to become pregnant.  If you drink alcohol: ? Limit how much you have to 0-1 drink a day. ? Be aware of how much alcohol is in your  drink. In the [place] one drink equals one 12 oz bottle of beer (355 mL), one 5 oz glass of wine (148 mL), or one 1 oz glass of hard liquor (44 mL). Lifestyle  Take daily care of your teeth and gums.  Stay active. Exercise for at least 30 minutes on 5 or more days each week.  Do not use any products that contain nicotine or tobacco, such as cigarettes, e-cigarettes, and chewing tobacco. If you need help quitting, ask your health care provider.  If you are sexually active, practice safe sex. Use a condom or other form of birth control (contraception) in order to prevent pregnancy and STIs (sexually transmitted infections). If you plan to become pregnant, see your health care provider for a preconception visit. What's next?  Visit your health care provider once a year for a well check visit.  Ask your health care provider how often you should have your eyes and teeth checked.  Stay up to date on all vaccines. This information is not intended to replace advice given to you by your health care provider. Make sure you discuss any questions you have with your health care provider. Document Revised: 04/12/2018 Document Reviewed: 04/12/2018 Elsevier Patient Education  2020 [employer].

## 2019-11-26 NOTE — Progress Notes (Signed)
Established Patient Office Visit  Subjective:  Patient ID: Alison Stevens, female    DOB: June 05, 1981  Age: 39 y.o. MRN: [id number]  CC:  Chief Complaint  Patient presents with  . Follow-up    follow up on medication and anxiety, patient states that she has pain at left rib cage and side x 6 month pain come and go.     HPI Alison Stevens presents for follow-up of her chest wall pain, health exam and her anxiety.  Chest wall pain continues.  She is quite active with her exercise bike and does do some upper body strengthening with light weights.  She is seeing cardiology in follow-up for this.  Her risk for a cardiac origin is quite low given her age and lipid profile.  She denies any difficulty breathing or dyspnea on exertion unless she takes her workouts to the max.  She is having no trouble with indigestion stooling or urination.  There is no abdominal pain.  She would like a chest x-ray.  She has anxiety mostly with travel.  She does not really need the Xanax otherwise.  She does admit to sadness and anxiety after she is experienced a late term spontaneous loss of pregnancy some years ago.  Other than traveling most of her anxiety occurs in the evening hours.  Past Medical History:  Diagnosis Date  . H/O cold sores   . SVD (spontaneous vaginal delivery)    x 2    Past Surgical History:  Procedure Laterality Date  . DILATION AND EVACUATION N/A 08/31/2017   Procedure: DILATATION AND EVACUATION;  Surgeon: Harold Hedge, MD;  Location: [facility] BIRTHING SUITES;  Service: Gynecology;  Laterality: N/A;  . WISDOM TOOTH EXTRACTION      No family history on file.  Social History   Socioeconomic History  . Marital status: Married    Spouse name: Not on file  . Number of children: Not on file  . Years of education: Not on file  . Highest education level: Not on file  Occupational History  . Not on file  Tobacco Use  . Smoking status: Former Smoker    Packs/day: 0.25    Years:  4.00    Pack years: 1.00    Types: Cigarettes  . Smokeless tobacco: Never Used  . Tobacco comment: Smoked in [employer] - 4 yrs  Substance and Sexual Activity  . Alcohol use: Yes    Comment: occ has 4-5 drinks over a 12 hour period.   . Drug use: No  . Sexual activity: Yes    Birth control/protection: None    Comment: approx [redacted] wks gestation per patient 08/25/17  Other Topics Concern  . Not on file  Social History Narrative  . Not on file   Social Determinants of Health   Financial Resource Strain:   . Difficulty of Paying Living Expenses:   Food Insecurity:   . Worried About Programme researcher, broadcasting/film/video in the Last Year:   . Barista in the Last Year:   Transportation Needs:   . Freight forwarder (Medical):   Marland Kitchen Lack of Transportation (Non-Medical):   Physical Activity:   . Days of Exercise per Week:   . Minutes of Exercise per Session:   Stress:   . Feeling of Stress :   Social Connections:   . Frequency of Communication with Friends and Family:   . Frequency of Social Gatherings with Friends and Family:   . Attends Religious  Services:   . Active Member of Clubs or Organizations:   . Attends Archivist Meetings:   Marland Kitchen Marital Status:   Intimate Partner Violence:   . Fear of Current or Ex-Partner:   . Emotionally Abused:   Marland Kitchen Physically Abused:   . Sexually Abused:     Outpatient Medications Prior to Visit  Medication Sig Dispense Refill  . ALPRAZolam (XANAX) 0.5 MG tablet Take one tablet daily as needed for anxiety. 5 tablet 0  . Doxylamine Succinate, Sleep, (UNISOM PO) Take 25 mg by mouth at bedtime.    . Ibuprofen (ADVIL PO) Take by mouth.    . OMEPRAZOLE PO Take by mouth.     No facility-administered medications prior to visit.    Allergies  Allergen Reactions  . Vicodin [Hydrocodone-Acetaminophen] Nausea And Vomiting    ROS Review of Systems  Constitutional: Negative.   HENT: Negative.   Eyes: Negative for photophobia and visual  disturbance.  Respiratory: Negative for chest tightness, shortness of breath and wheezing.   Cardiovascular: Positive for chest pain. Negative for palpitations and leg swelling.  Gastrointestinal: Negative for abdominal pain, constipation, diarrhea, nausea and vomiting.  Endocrine: Negative for polyphagia and polyuria.  Genitourinary: Negative.   Musculoskeletal: Negative for gait problem and joint swelling.  Skin: Negative for pallor and rash.  Allergic/Immunologic: Negative for immunocompromised state.  Neurological: Negative for tremors and speech difficulty.  Hematological: Does not bruise/bleed easily.  Psychiatric/Behavioral: The patient is nervous/anxious.    Depression screen [facility] 2/9 11/26/2019 11/26/2019  Decreased Interest 0 0  Down, Depressed, Hopeless 0 0  PHQ - 2 Score 0 0  Altered sleeping 3 -  Tired, decreased energy 0 -  Change in appetite 0 -  Feeling bad or failure about yourself  0 -  Trouble concentrating 2 -  Moving slowly or fidgety/restless 0 -  Suicidal thoughts 0 -  PHQ-9 Score 5 -  Difficult doing work/chores Somewhat difficult -      Objective:    Physical Exam  Constitutional: She is oriented to person, place, and time. She appears well-developed and well-nourished. No distress.  HENT:  Head: Normocephalic and atraumatic.  Right Ear: External ear normal.  Left Ear: External ear normal.  Eyes: Pupils are equal, round, and reactive to light. Conjunctivae are normal. Right eye exhibits no discharge. Left eye exhibits no discharge.  Neck: No JVD present. No tracheal deviation present. No thyromegaly present.  Cardiovascular: Normal rate and regular rhythm.  Pulmonary/Chest: Effort normal and breath sounds normal. No stridor.  Abdominal: Bowel sounds are normal.  Musculoskeletal:        General: No edema.     Cervical back: Neck supple.  Lymphadenopathy:    She has no cervical adenopathy.  Neurological: She is alert and oriented to person, place,  and time.  Skin: Skin is warm and dry. She is not diaphoretic.  Psychiatric: She has a normal mood and affect.    BP 118/64   Pulse 92   Temp 98.1 F (36.7 C) (Tympanic)   Ht 5\' 6"  (1.676 m)   Wt 127 lb 9.6 oz (57.9 kg)   SpO2 98%   BMI 20.60 kg/m  Wt Readings from Last 3 Encounters:  11/26/19 127 lb 9.6 oz (57.9 kg)  10/18/19 136 lb (61.7 kg)  09/13/19 132 lb 3.2 oz (60 kg)     Health Maintenance Due  Topic Date Due  . TETANUS/TDAP  Never done  . PAP SMEAR-Modifier  Never done  There are no preventive care reminders to display for this patient.  No results found for: TSH Lab Results  Component Value Date   WBC 7.3 06/21/2018   HGB 13.6 06/21/2018   HCT 40.1 06/21/2018   MCV 90.7 06/21/2018   PLT 268.0 06/21/2018   Lab Results  Component Value Date   NA 138 06/21/2018   K 4.4 06/21/2018   CO2 29 06/21/2018   GLUCOSE 95 06/21/2018   BUN 9 06/21/2018   CREATININE 0.69 06/21/2018   BILITOT 0.4 06/21/2018   ALKPHOS 48 06/21/2018   AST 14 06/21/2018   ALT 6 06/21/2018   PROT 7.9 06/21/2018   ALBUMIN 5.1 06/21/2018   CALCIUM 9.9 06/21/2018   GFR 101.37 06/21/2018   Lab Results  Component Value Date   CHOL 201 (H) 06/21/2018   Lab Results  Component Value Date   HDL 57.80 06/21/2018   Lab Results  Component Value Date   LDLCALC 120 (H) 06/21/2018   Lab Results  Component Value Date   TRIG 117.0 06/21/2018   Lab Results  Component Value Date   CHOLHDL 3 06/21/2018   No results found for: HGBA1C    Assessment & Plan:   Problem List Items Addressed This Visit      Musculoskeletal and Integument   Costochondritis   Relevant Orders   DG Chest 2 View     Other   Adjustment disorder with anxiety   Relevant Orders   Ambulatory referral to Psychology   Atypical chest pain - Primary   Relevant Orders   DG Chest 2 View    Other Visit Diagnoses    Healthcare maintenance       Relevant Orders   CBC   Comprehensive metabolic panel    Iron, TIBC and Ferritin Panel   Lipid panel   Urinalysis, Routine w reflex microscopic   Psychophysiological insomnia       Relevant Medications   traZODone (DESYREL) 50 MG tablet      Meds ordered this encounter  Medications  . traZODone (DESYREL) 50 MG tablet    Sig: Take 0.5-1 tablets (25-50 mg total) by mouth at bedtime as needed for sleep.    Dispense:  30 tablet    Refill:  1    Follow-up: Return in about 6 months (around 05/27/2020), or if symptoms worsen or fail to improve.  She explains that she has a Statistician but agrees to go for counseling.  Believe that at least some of her mental duress may be associated with unresolved issue after her loss of late term pregnancy some years ago.  Am okay with small amounts of Xanax used when she travels.  She agrees to give trazodone to try for sleep. Mliss Sax, MD

## 2019-11-27 LAB — IRON,TIBC AND FERRITIN PANEL
%SAT: 34 % (calc) (ref 16–45)
Ferritin: 36 ng/mL (ref 16–154)
Iron: 142 ug/dL (ref 40–190)
TIBC: 421 mcg/dL (calc) (ref 250–450)

## 2019-12-06 ENCOUNTER — Encounter: Payer: Self-pay | Admitting: Family Medicine

## 2019-12-06 ENCOUNTER — Other Ambulatory Visit: Payer: Self-pay | Admitting: Family Medicine

## 2019-12-06 DIAGNOSIS — F4322 Adjustment disorder with anxiety: Secondary | ICD-10-CM

## 2019-12-06 NOTE — Telephone Encounter (Signed)
Patient called back to check the status for medication refill. If approved pt is requesting medication be sent to [facility] on [address] [place] [address]. CB is [phone]

## 2019-12-09 MED ORDER — ALPRAZOLAM 0.5 MG PO TABS: ORAL_TABLET | ORAL | 0 refills | Status: DC

## 2019-12-09 NOTE — Telephone Encounter (Signed)
WK-plz see refill req/thx dmf

## 2019-12-11 MED ORDER — ALPRAZOLAM 0.5 MG PO TABS: ORAL_TABLET | ORAL | 0 refills | Status: DC

## 2019-12-11 NOTE — Telephone Encounter (Signed)
Patient called for status on medication while in [place] Rx was sent to originally [facility] in [place] then switched to pharmacy in [place]. Patient sent a message via Mychart stating that she was back in [place]. I called and had Rx canceled in [place]. Patient states that she will pick up Rx from original pharmacy in [place].

## 2020-01-03 ENCOUNTER — Other Ambulatory Visit: Payer: Self-pay | Admitting: Family Medicine

## 2020-01-03 DIAGNOSIS — F4322 Adjustment disorder with anxiety: Secondary | ICD-10-CM

## 2020-01-07 ENCOUNTER — Encounter: Payer: Self-pay | Admitting: Family Medicine

## 2020-01-07 ENCOUNTER — Other Ambulatory Visit: Payer: Self-pay | Admitting: Family Medicine

## 2020-01-07 DIAGNOSIS — F4322 Adjustment disorder with anxiety: Secondary | ICD-10-CM

## 2020-01-07 NOTE — Telephone Encounter (Signed)
Last OV 11/26/19 Last fill 12/11/19  #5/0

## 2020-01-07 NOTE — Telephone Encounter (Signed)
Patient calling for refill on pending medication last refill of 5 pills was  12/11/19. Please advise

## 2020-01-08 MED ORDER — ALPRAZOLAM 0.5 MG PO TABS: ORAL_TABLET | ORAL | 0 refills | Status: DC

## 2020-02-05 ENCOUNTER — Telehealth: Payer: Self-pay | Admitting: Family Medicine

## 2020-02-05 DIAGNOSIS — Z01419 Encounter for gynecological examination (general) (routine) without abnormal findings: Secondary | ICD-10-CM | POA: Diagnosis not present

## 2020-02-05 DIAGNOSIS — F419 Anxiety disorder, unspecified: Secondary | ICD-10-CM | POA: Diagnosis not present

## 2020-02-05 DIAGNOSIS — Z6820 Body mass index (BMI) 20.0-20.9, adult: Secondary | ICD-10-CM | POA: Diagnosis not present

## 2020-02-05 DIAGNOSIS — N920 Excessive and frequent menstruation with regular cycle: Secondary | ICD-10-CM | POA: Diagnosis not present

## 2020-02-05 NOTE — Telephone Encounter (Signed)
Patient called to say she no longer needs refill for Xanax. She will get future refills through her other doctor.

## 2020-06-02 ENCOUNTER — Ambulatory Visit: Payer: BC Managed Care – PPO | Admitting: Family Medicine

## 2020-07-24 IMAGING — DX DG CHEST 2V
2 series · 2 of 2 positions shown · non-contrast
Comparison: None.

CLINICAL DATA: Chest pain

EXAM:
CHEST - 2 VIEW

[chest pa]
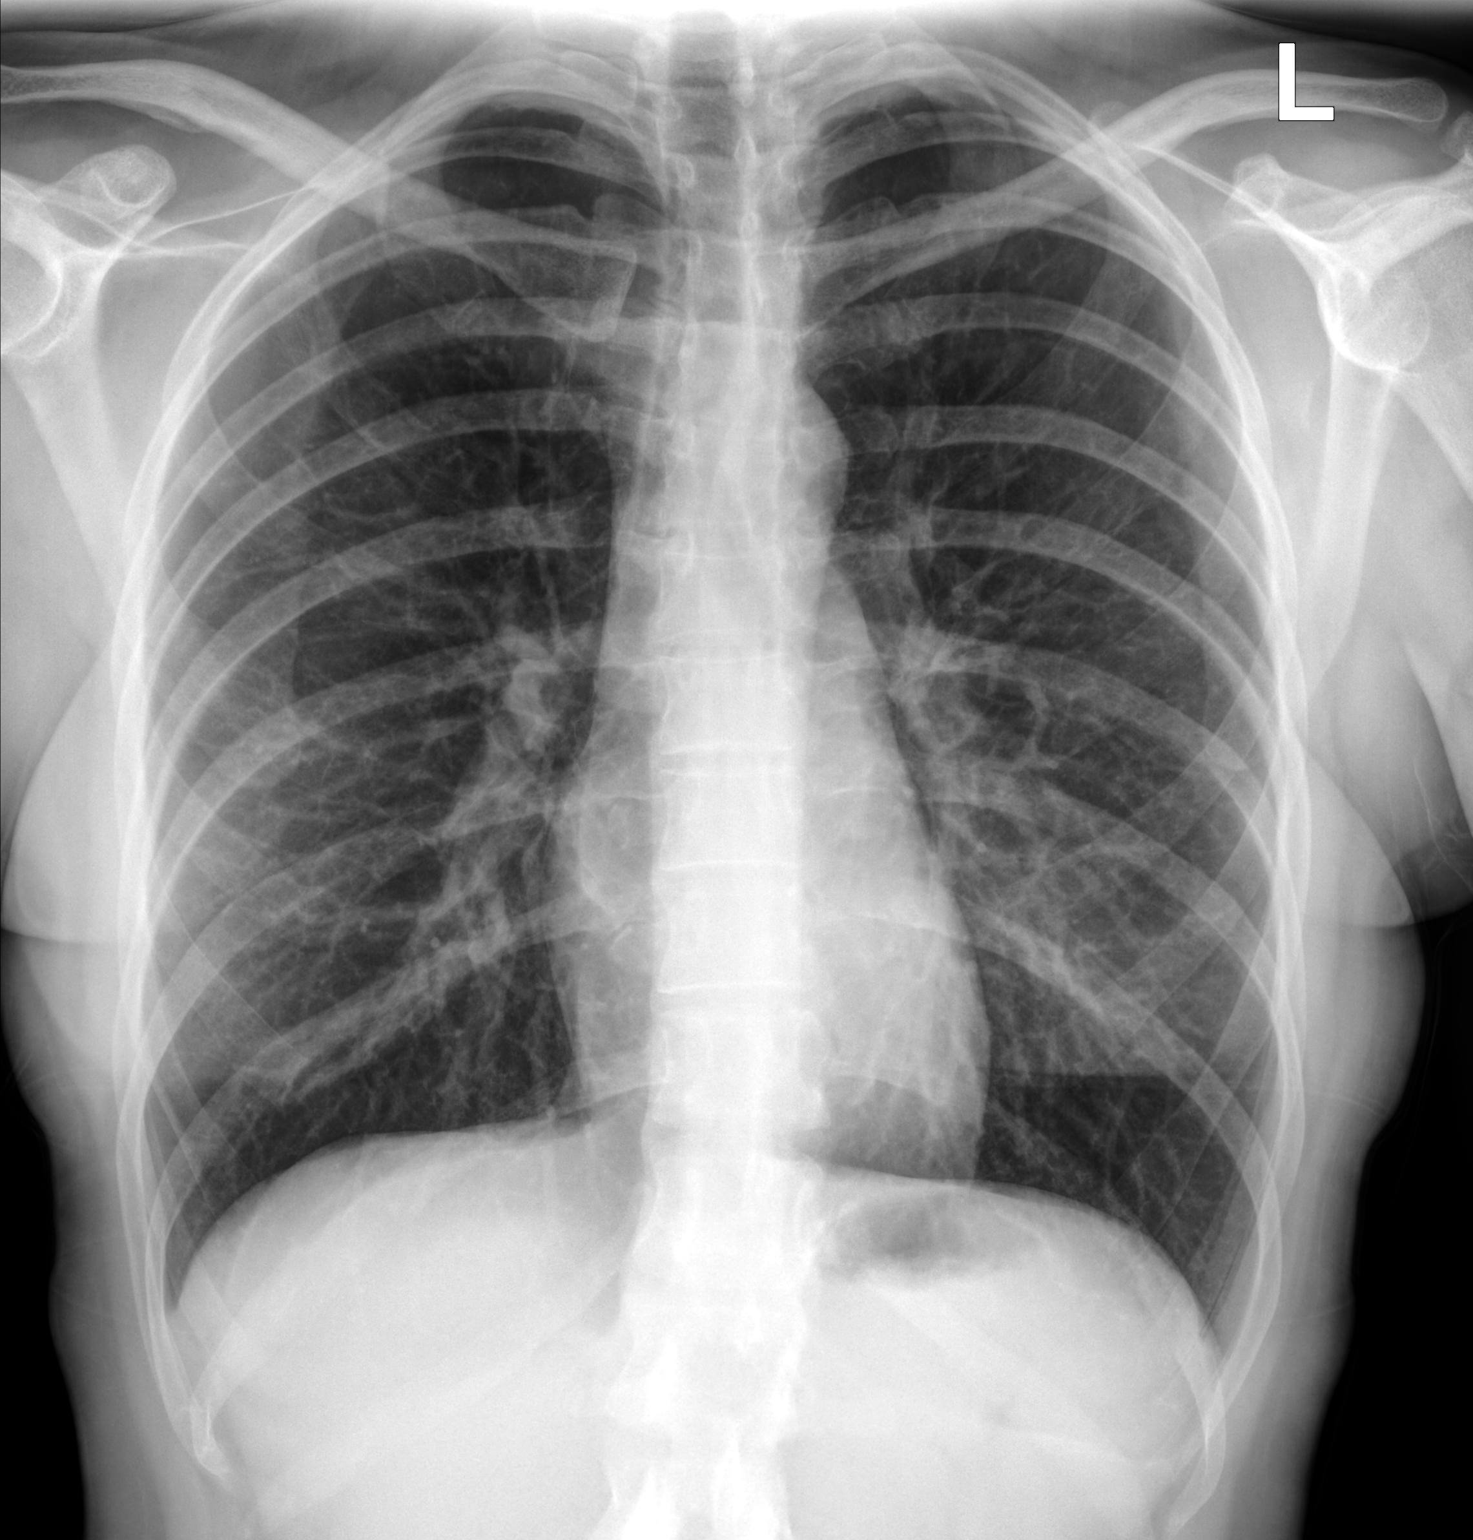

[chest lat]
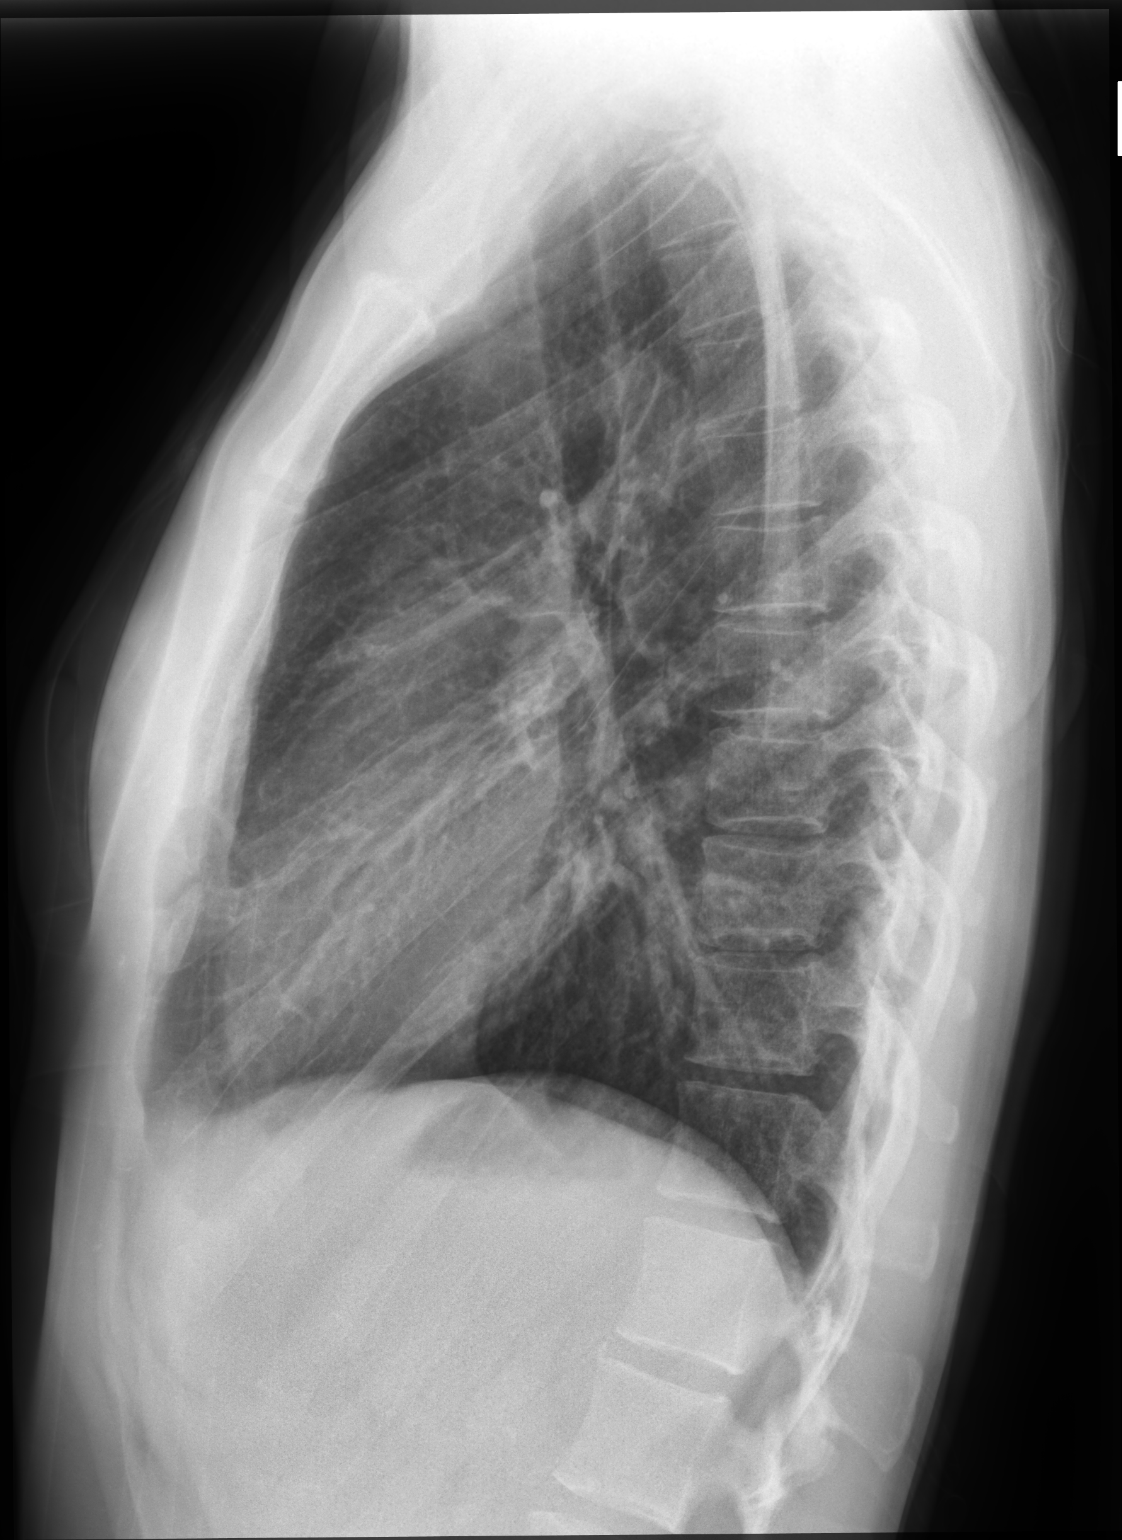

[2 of 2 positions shown; findings below may reference images not displayed]

FINDINGS: The heart size and mediastinal contours are within normal limits.
Both lungs are clear. The visualized skeletal structures are
unremarkable.
IMPRESSION: No active cardiopulmonary disease.

## 2020-09-23 DIAGNOSIS — H532 Diplopia: Secondary | ICD-10-CM | POA: Diagnosis not present

## 2020-09-23 DIAGNOSIS — H01004 Unspecified blepharitis left upper eyelid: Secondary | ICD-10-CM | POA: Diagnosis not present

## 2020-09-23 DIAGNOSIS — H5702 Anisocoria: Secondary | ICD-10-CM | POA: Diagnosis not present

## 2020-09-23 DIAGNOSIS — H01001 Unspecified blepharitis right upper eyelid: Secondary | ICD-10-CM | POA: Diagnosis not present

## 2020-09-28 ENCOUNTER — Other Ambulatory Visit: Payer: Self-pay

## 2020-09-28 DIAGNOSIS — H01004 Unspecified blepharitis left upper eyelid: Secondary | ICD-10-CM | POA: Diagnosis not present

## 2020-09-28 DIAGNOSIS — H01001 Unspecified blepharitis right upper eyelid: Secondary | ICD-10-CM | POA: Diagnosis not present

## 2020-09-28 DIAGNOSIS — H5702 Anisocoria: Secondary | ICD-10-CM | POA: Diagnosis not present

## 2020-09-29 ENCOUNTER — Ambulatory Visit (INDEPENDENT_AMBULATORY_CARE_PROVIDER_SITE_OTHER): Payer: BC Managed Care – PPO | Admitting: Nurse Practitioner

## 2020-09-29 ENCOUNTER — Encounter: Payer: Self-pay | Admitting: Nurse Practitioner

## 2020-09-29 VITALS — BP 100/70 | HR 90 | Temp 98.1°F | Ht 66.0 in | Wt 129.8 lb

## 2020-09-29 DIAGNOSIS — G43011 Migraine without aura, intractable, with status migrainosus: Secondary | ICD-10-CM

## 2020-09-29 MED ORDER — SUMATRIPTAN SUCCINATE 25 MG PO TABS: 25.0000 mg | ORAL_TABLET | ORAL | 0 refills | Status: DC | PRN

## 2020-09-29 NOTE — Patient Instructions (Signed)
Start imitrex You will be contacted to schedule appt for head CT.  Migraine Headache A migraine headache is an intense, throbbing pain on one side or both sides of the head. Migraine headaches may also cause other symptoms, such as nausea, vomiting, and sensitivity to light and noise. A migraine headache can last from 4 hours to 3 days. Talk with your doctor about what things may bring on (trigger) your migraine headaches. What are the causes? The exact cause of this condition is not known. However, a migraine may be caused when nerves in the brain become irritated and release chemicals that cause inflammation of blood vessels. This inflammation causes pain. This condition may be triggered or caused by:  Drinking alcohol.  Smoking.  Taking medicines, such as: ? Medicine used to treat chest pain (nitroglycerin). ? Birth control pills. ? Estrogen. ? Certain blood pressure medicines.  Eating or drinking products that contain nitrates, glutamate, aspartame, or tyramine. Aged cheeses, chocolate, or caffeine may also be triggers.  Doing physical activity. Other things that may trigger a migraine headache include:  Menstruation.  Pregnancy.  Hunger.  Stress.  Lack of sleep or too much sleep.  Weather changes.  Fatigue. What increases the risk? The following factors may make you more likely to experience migraine headaches:  Being a certain age. This condition is more common in people who are 72-58 years old.  Being female.  Having a family history of migraine headaches.  Being Caucasian.  Having a mental health condition, such as depression or anxiety.  Being obese. What are the signs or symptoms? The main symptom of this condition is pulsating or throbbing pain. This pain may:  Happen in any area of the head, such as on one side or both sides.  Interfere with daily activities.  Get worse with physical activity.  Get worse with exposure to bright lights or loud  noises. Other symptoms may include:  Nausea.  Vomiting.  Dizziness.  General sensitivity to bright lights, loud noises, or smells. Before you get a migraine headache, you may get warning signs (an aura). An aura may include:  Seeing flashing lights or having blind spots.  Seeing bright spots, halos, or zigzag lines.  Having tunnel vision or blurred vision.  Having numbness or a tingling feeling.  Having trouble talking.  Having muscle weakness. Some people have symptoms after a migraine headache (postdromal phase), such as:  Feeling tired.  Difficulty concentrating. How is this diagnosed? A migraine headache can be diagnosed based on:  Your symptoms.  A physical exam.  Tests, such as: ? CT scan or an MRI of the head. These imaging tests can help rule out other causes of headaches. ? Taking fluid from the spine (lumbar puncture) and analyzing it (cerebrospinal fluid analysis, or CSF analysis). How is this treated? This condition may be treated with medicines that:  Relieve pain.  Relieve nausea.  Prevent migraine headaches. Treatment for this condition may also include:  Acupuncture.  Lifestyle changes like avoiding foods that trigger migraine headaches.  Biofeedback.  Cognitive behavioral therapy. Follow these instructions at home: Medicines  Take over-the-counter and prescription medicines only as told by your health care provider.  Ask your health care provider if the medicine prescribed to you: ? Requires you to avoid driving or using heavy machinery. ? Can cause constipation. You may need to take these actions to prevent or treat constipation:  Drink enough fluid to keep your urine pale yellow.  Take over-the-counter or prescription medicines.  Eat foods that are high in fiber, such as beans, whole grains, and fresh fruits and vegetables.  Limit foods that are high in fat and processed sugars, such as fried or sweet foods. Lifestyle  Do not  drink alcohol.  Do not use any products that contain nicotine or tobacco, such as cigarettes, e-cigarettes, and chewing tobacco. If you need help quitting, ask your health care provider.  Get at least 8 hours of sleep every night.  Find ways to manage stress, such as meditation, deep breathing, or yoga. General instructions  Keep a journal to find out what may trigger your migraine headaches. For example, write down: ? What you eat and drink. ? How much sleep you get. ? Any change to your diet or medicines.  If you have a migraine headache: ? Avoid things that make your symptoms worse, such as bright lights. ? It may help to lie down in a dark, quiet room. ? Do not drive or use heavy machinery. ? Ask your health care provider what activities are safe for you while you are experiencing symptoms.  Keep all follow-up visits as told by your health care provider. This is important.      Contact a health care provider if:  You develop symptoms that are different or more severe than your usual migraine headache symptoms.  You have more than 15 headache days in one month. Get help right away if:  Your migraine headache becomes severe.  Your migraine headache lasts longer than 72 hours.  You have a fever.  You have a stiff neck.  You have vision loss.  Your muscles feel weak or like you cannot control them.  You start to lose your balance often.  You have trouble walking.  You faint.  You have a seizure. Summary  A migraine headache is an intense, throbbing pain on one side or both sides of the head. Migraines may also cause other symptoms, such as nausea, vomiting, and sensitivity to light and noise.  This condition may be treated with medicines and lifestyle changes. You may also need to avoid certain things that trigger a migraine headache.  Keep a journal to find out what may trigger your migraine headaches.  Contact your health care provider if you have more than  15 headache days in a month or you develop symptoms that are different or more severe than your usual migraine headache symptoms. This information is not intended to replace advice given to you by your health care provider. Make sure you discuss any questions you have with your health care provider. Document Revised: 11/23/2018 Document Reviewed: 09/13/2018 Elsevier Patient Education  2021 [employer].

## 2020-09-29 NOTE — Progress Notes (Signed)
Subjective:  Patient ID: Alison Stevens, female    DOB: Dec 26, 1980  Age: 40 y.o. MRN: [id number]  CC: Acute Visit (Pt c/o headaches, blurred vision, and dizziness x2 weeks. Pt states she went to her eye doctor and was informed to follow up with her PCP. Pt has been taking excedrin migraine with no relief)  Headache  This is a new problem. The current episode started 1 to 4 weeks ago. The problem occurs constantly. The problem has been waxing and waning. The pain is located in the bilateral region. The pain does not radiate. The pain quality is not similar to prior headaches. The quality of the pain is described as aching and throbbing. The pain is severe. Associated symptoms include blurred vision and dizziness. Pertinent negatives include no abdominal pain, abnormal behavior, anorexia, back pain, coughing, drainage, ear pain, eye pain, eye redness, eye watering, facial sweating, fever, hearing loss, insomnia, loss of balance, muscle aches, nausea, neck pain, numbness, phonophobia, photophobia, rhinorrhea, scalp tenderness, seizures, sinus pressure, sore throat, swollen glands, tingling, tinnitus, visual change, vomiting, weakness or weight loss. The symptoms are aggravated by unknown. She has tried acetaminophen, NSAIDs and Excedrin for the symptoms. The treatment provided mild relief. Her past medical history is significant for migraines in the family. There is no history of cancer, cluster headaches, hypertension, immunosuppression, migraine headaches, obesity, recent head traumas, sinus disease or TMJ.  Blurry vision and dizziness has resolved, but headache is persistent. Taking COC x71months, dose changed 40months ago to manage breakthrough bleeding. She had ophthalmology appt last week, normal per aptient  Reviewed past Medical, Social and Family history today.  Outpatient Medications Prior to Visit  Medication Sig Dispense Refill  . ALPRAZolam (XANAX) 0.5 MG tablet Take one tablet daily as  needed for anxiety. 5 tablet 0  . Aspirin-Acetaminophen-Caffeine (EXCEDRIN MIGRAINE PO) Take by mouth.    . Doxylamine Succinate, Sleep, (UNISOM PO) Take 25 mg by mouth at bedtime.    . Ibuprofen (ADVIL PO) Take by mouth.    Lorita Officer Triphasic (TRI-SPRINTEC PO) Take by mouth.    . OMEPRAZOLE PO Take by mouth. (Patient not taking: Reported on 09/29/2020)    . traZODone (DESYREL) 50 MG tablet Take 0.5-1 tablets (25-50 mg total) by mouth at bedtime as needed for sleep. (Patient not taking: Reported on 09/29/2020) 30 tablet 1   No facility-administered medications prior to visit.   ROS See HPI  Objective:  BP 100/70 (BP Location: Left Arm, Patient Position: Sitting, Cuff Size: Normal)   Pulse 90   Temp 98.1 F (36.7 C) (Temporal)   Ht 5\' 6"  (1.676 m)   Wt 129 lb 12.8 oz (58.9 kg)   LMP 09/14/2020   SpO2 98%   Breastfeeding No   BMI 20.95 kg/m   Physical Exam Vitals reviewed.  Eyes:     Extraocular Movements: Extraocular movements intact.     Conjunctiva/sclera: Conjunctivae normal.     Pupils: Pupils are equal, round, and reactive to light.  Cardiovascular:     Rate and Rhythm: Normal rate and regular rhythm.     Pulses: Normal pulses.     Heart sounds: Normal heart sounds.  Pulmonary:     Effort: Pulmonary effort is normal.     Breath sounds: Normal breath sounds.  Musculoskeletal:     Cervical back: Normal range of motion and neck supple.  Lymphadenopathy:     Cervical: No cervical adenopathy.  Neurological:     Mental Status: She is alert  and oriented to person, place, and time.     Cranial Nerves: No cranial nerve deficit.     Coordination: Coordination normal.     Gait: Gait normal.  Psychiatric:        Mood and Affect: Mood normal.        Behavior: Behavior normal.        Thought Content: Thought content normal.     Assessment & Plan:  This visit occurred during the SARS-CoV-2 public health emergency.  Safety protocols were in place, including  screening questions prior to the visit, additional usage of staff PPE, and extensive cleaning of exam room while observing appropriate contact time as indicated for disinfecting solutions.   Dajuana was seen today for acute visit.  Diagnoses and all orders for this visit:  Intractable migraine without aura and with status migrainosus -     CT Head Wo Contrast; Future -     SUMAtriptan (IMITREX) 25 MG tablet; Take 1 tablet (25 mg total) by mouth every 2 (two) hours as needed for migraine. May repeat in 2 hours if headache persists or recurs. No more than 100mg  in 24hrs   Problem List Items Addressed This Visit   None   Visit Diagnoses    Intractable migraine without aura and with status migrainosus    -  Primary   Relevant Medications   Aspirin-Acetaminophen-Caffeine (EXCEDRIN MIGRAINE PO)   SUMAtriptan (IMITREX) 25 MG tablet   Other Relevant Orders   CT Head Wo Contrast      Follow-up: Return if symptoms worsen or fail to improve.  , NP

## 2020-10-01 ENCOUNTER — Other Ambulatory Visit: Payer: Self-pay

## 2020-10-01 ENCOUNTER — Ambulatory Visit
Admission: RE | Admit: 2020-10-01 | Discharge: 2020-10-01 | Disposition: A | Discharge status: Home / Self Care | Payer: BC Managed Care – PPO | Source: Ambulatory Visit | Attending: Nurse Practitioner | Admitting: Nurse Practitioner

## 2020-10-01 DIAGNOSIS — G43011 Migraine without aura, intractable, with status migrainosus: Secondary | ICD-10-CM

## 2020-10-05 ENCOUNTER — Encounter: Payer: Self-pay | Admitting: Nurse Practitioner

## 2020-10-05 DIAGNOSIS — G43011 Migraine without aura, intractable, with status migrainosus: Secondary | ICD-10-CM

## 2020-10-27 ENCOUNTER — Encounter: Payer: Self-pay | Admitting: Neurology

## 2020-10-29 ENCOUNTER — Telehealth: Payer: Self-pay | Admitting: Family Medicine

## 2020-10-29 NOTE — Telephone Encounter (Signed)
Spoke with patient regarding request for MRI offered an appointment to come in for evaluation but patient declined the appointment. Per patient she does not have time to keep coming in  PCP's office patient seen Alison Stevens on 09/29/20 had CT 10/01/20 would like MRI ordered for abnormal findings on CT. Neurology appointment 12/29/20. Please advise.

## 2020-10-29 NOTE — Telephone Encounter (Signed)
Pt called and asked if an MRI could be put in based on an abnormality on CT done 10/01/20. Please advise

## 2020-10-30 NOTE — Telephone Encounter (Signed)
Lesions in the cerebellum, if they represent strokes, occurred in the past and were likely not related to your presenting symptom of headache. Agree with Claris Gower, please return to clinic.

## 2020-10-30 NOTE — Telephone Encounter (Signed)
Called to schedule appointment for evaluation and to make patient aware of recommendations no answer LM also  forwarded message below to patient via mychart regarding request for MRI. Asked patient to give [place] a call to schedule an appointment.

## 2020-11-24 DIAGNOSIS — L7211 Pilar cyst: Secondary | ICD-10-CM | POA: Diagnosis not present

## 2020-11-24 DIAGNOSIS — D2272 Melanocytic nevi of left lower limb, including hip: Secondary | ICD-10-CM | POA: Diagnosis not present

## 2020-11-24 DIAGNOSIS — D2372 Other benign neoplasm of skin of left lower limb, including hip: Secondary | ICD-10-CM | POA: Diagnosis not present

## 2020-11-24 DIAGNOSIS — L905 Scar conditions and fibrosis of skin: Secondary | ICD-10-CM | POA: Diagnosis not present

## 2020-12-29 ENCOUNTER — Ambulatory Visit: Payer: BC Managed Care – PPO | Admitting: Neurology

## 2021-01-01 ENCOUNTER — Encounter: Payer: Self-pay | Admitting: Neurology

## 2021-04-06 NOTE — Progress Notes (Signed)
NEUROLOGY CONSULTATION NOTE  Alison Stevens MRN: [id number] DOB: [birth year]  Referring provider: Anne Ng, NP Primary care provider: Nadene Rubins, MD  Reason for consult:  migraine  Assessment/Plan:   Migraine without aura, without status migrainosus, not intractable (improved during summer when she has less stress) Intermittent polyopia - unclear etiology but possibly migrainous (improved during summer when she has less stress) Intermittent vertigo - unclear etiology but possibly migrainous (improved during summer when she has less stress) Fasciculations - likely benign (improved during summer when she has less stress) Subjective short term memory deficits Abnormal head CT - unclear etiology  To treat migraines, start nortriptyline 10mg  at bedtime.  We can increase dose in 4 weeks if needed. MRI of brain with and without contrast NCV-EMG to evaluate fasciculations To evaluate memory, check B12 and TSH Follow up after testing.    Subjective:  Alison Stevens is a 40 year old right-handed female who presents for migraines.  History supplemented by referring provider's note.  Around 2020-2021 (1 1/2 to 2 years ago), she started experiencing migraines.  It was around the time she started birth control medication but believes symptoms started prior to that.  She has severe headaches, various location (across forehead, unilateral either side, top of head), throbbing and pressure.  Associated nausea, photophobia, phonophobia.  2 Excedrin will knock it out in a few hours but may last all day.  They occur on average once a month.    Around the same time, she developed episodes of vertigo.  It would occur randomly and not necessarily with movement or change in position.  It feels like the floor is moving, sometimes spinning sensation.  At first, it would occur on and off for a couple of days.  Lately, they have been occurring once a month.  Denies headache.  Denies  correlation with her migraines.  Since January 2022, she has had intermittent double vision.  She notes images stacked on top of one another.  Often, it involves multiple images.  Sometimes preceded by vertigo and nausea.  No associated headache.  Usually lasts 10 minutes but once 20-30 minutes.  It has occurred about 5 times since January. Eye exam in February was normal.  CT head on 10/01/2020 personally reviewed showed subcentimeter hypodensity within the bilateral cerebellar hemispheres.    For the past few months, she reports muscle twitches.  Initially involved the legs but then spread to the entire body.    She reports short-term memory issues.  Trouble sleeping.   Current NSAIDS/analgesics:  Excedrin Current triptans:  sumatriptan 25mg  (never filled it) Current ergotamine:  none Current anti-emetic:  none Current muscle relaxants:  none Current Antihypertensive medications:  none Current Antidepressant medications:  none Current Anticonvulsant medications:  none Current anti-CGRP:  none Current Vitamins/Herbal/Supplements:  none Current Antihistamines/Decongestants:  none Other therapy:  none Hormone/birth control:  Tri-Sprintec Other medications:  Xanax  Past NSAIDS/analgesics:  Excedrin Past abortive triptans:  none Past abortive ergotamine:  none Past muscle relaxants:  none Past anti-emetic:  none Past antihypertensive medications:  none Past antidepressant medications:  trazodone (sleep), cannot tolerate SSRIs Past anticonvulsant medications:  none Past anti-CGRP:  none Past vitamins/Herbal/Supplements:  none Past antihistamines/decongestants:  none Other past therapies:  none  Family History: Paternal cousin - seizure disorder Daughter - febrile seizures Paternal grandmother - dementia      PAST MEDICAL HISTORY: Past Medical History:  Diagnosis Date   H/O cold sores    SVD (spontaneous vaginal delivery)  x 2    PAST SURGICAL HISTORY: Past Surgical  History:  Procedure Laterality Date   DILATION AND EVACUATION N/A 08/31/2017   Procedure: DILATATION AND EVACUATION;  Surgeon: Harold Hedge, MD;  Location: [facility];  Service: Gynecology;  Laterality: N/A;   WISDOM TOOTH EXTRACTION      MEDICATIONS: Current Outpatient Medications on File Prior to Visit  Medication Sig Dispense Refill   ALPRAZolam (XANAX) 0.5 MG tablet Take one tablet daily as needed for anxiety. 5 tablet 0   Ibuprofen (ADVIL PO) Take by mouth.     Norgestim-Eth Estrad Triphasic (TRI-SPRINTEC PO) Take by mouth.     SUMAtriptan (IMITREX) 25 MG tablet Take 1 tablet (25 mg total) by mouth every 2 (two) hours as needed for migraine. May repeat in 2 hours if headache persists or recurs. No more than 100mg  in 24hrs 10 tablet 0   No current facility-administered medications on file prior to visit.    ALLERGIES: Allergies  Allergen Reactions   Vicodin [Hydrocodone-Acetaminophen] Nausea And Vomiting    FAMILY HISTORY: No family history on file.  Objective:  Blood pressure 123/85, pulse 90, height 5\' 6"  (1.676 m), weight 135 lb 3.2 oz (61.3 kg), SpO2 98 %. General: No acute distress.  Patient appears well-groomed.   Head:  Normocephalic/atraumatic Eyes:  fundi examined but not visualized Neck: supple, no paraspinal tenderness, full range of motion Back: No paraspinal tenderness Heart: regular rate and rhythm Lungs: Clear to auscultation bilaterally. Vascular: No carotid bruits. Neurological Exam: Mental status: alert and oriented to person, place, and time, recent and remote memory intact, fund of knowledge intact, attention and concentration intact, speech fluent and not dysarthric, language intact. Cranial nerves: CN I: not tested CN II: pupils equal, round and reactive to light, visual fields intact CN III, IV, VI:  full range of motion, no nystagmus, no ptosis CN V: facial sensation intact. CN VII: upper and lower face symmetric CN VIII: hearing  intact CN IX, X: gag intact, uvula midline CN XI: sternocleidomastoid and trapezius muscles intact CN XII: tongue midline Bulk & Tone: normal, no fasciculations. Motor:  muscle strength 5/5 throughout.  Patient showed me video on her phone of the muscle twitching on her leg, which appear to be fasciculations. Sensation:  Pinprick, temperature and vibratory sensation intact. Deep Tendon Reflexes:  2+ throughout,  toes downgoing.   Finger to nose testing:  Without dysmetria.   Heel to shin:  Without dysmetria.   Gait:  Normal station and stride.  Romberg negative.    Thank you for allowing me to take part in the care of this patient.  , DO  CC:  , MD  Shon Millet, NP

## 2021-04-08 ENCOUNTER — Encounter: Payer: Self-pay | Admitting: Neurology

## 2021-04-08 ENCOUNTER — Ambulatory Visit (INDEPENDENT_AMBULATORY_CARE_PROVIDER_SITE_OTHER): Payer: BC Managed Care – PPO | Admitting: Neurology

## 2021-04-08 ENCOUNTER — Other Ambulatory Visit: Payer: Self-pay

## 2021-04-08 VITALS — BP 123/85 | HR 90 | Ht 66.0 in | Wt 135.2 lb

## 2021-04-08 DIAGNOSIS — R42 Dizziness and giddiness: Secondary | ICD-10-CM

## 2021-04-08 DIAGNOSIS — R413 Other amnesia: Secondary | ICD-10-CM

## 2021-04-08 DIAGNOSIS — G43009 Migraine without aura, not intractable, without status migrainosus: Secondary | ICD-10-CM

## 2021-04-08 DIAGNOSIS — R253 Fasciculation: Secondary | ICD-10-CM | POA: Diagnosis not present

## 2021-04-08 DIAGNOSIS — H532 Diplopia: Secondary | ICD-10-CM | POA: Diagnosis not present

## 2021-04-08 MED ORDER — NORTRIPTYLINE HCL 10 MG PO CAPS: 10.0000 mg | ORAL_CAPSULE | Freq: Every day | ORAL | 5 refills | Status: DC

## 2021-04-08 NOTE — Patient Instructions (Signed)
Start nortriptyline 10mg  at bedtime.  If no improvement in headaches and spells in 4 weeks, then contact me and we can increase dose MRI of brain with and without contrast Nerve conduction study Further recommendations pending results.  Otherwise, follow up 6 months.

## 2021-04-27 DIAGNOSIS — Z1231 Encounter for screening mammogram for malignant neoplasm of breast: Secondary | ICD-10-CM | POA: Diagnosis not present

## 2021-04-27 DIAGNOSIS — Z01419 Encounter for gynecological examination (general) (routine) without abnormal findings: Secondary | ICD-10-CM | POA: Diagnosis not present

## 2021-04-27 DIAGNOSIS — Z6820 Body mass index (BMI) 20.0-20.9, adult: Secondary | ICD-10-CM | POA: Diagnosis not present

## 2021-04-29 ENCOUNTER — Ambulatory Visit
Admission: RE | Admit: 2021-04-29 | Discharge: 2021-04-29 | Disposition: A | Discharge status: Home / Self Care | Payer: BC Managed Care – PPO | Source: Ambulatory Visit | Attending: Neurology | Admitting: Neurology

## 2021-04-29 ENCOUNTER — Other Ambulatory Visit: Payer: Self-pay

## 2021-04-29 DIAGNOSIS — R253 Fasciculation: Secondary | ICD-10-CM | POA: Diagnosis not present

## 2021-04-29 DIAGNOSIS — H532 Diplopia: Secondary | ICD-10-CM

## 2021-04-29 DIAGNOSIS — G43009 Migraine without aura, not intractable, without status migrainosus: Secondary | ICD-10-CM

## 2021-04-29 DIAGNOSIS — R413 Other amnesia: Secondary | ICD-10-CM | POA: Diagnosis not present

## 2021-04-29 DIAGNOSIS — G43909 Migraine, unspecified, not intractable, without status migrainosus: Secondary | ICD-10-CM | POA: Diagnosis not present

## 2021-04-29 DIAGNOSIS — R42 Dizziness and giddiness: Secondary | ICD-10-CM

## 2021-04-29 MED ORDER — GADOBENATE DIMEGLUMINE 529 MG/ML IV SOLN
12.0000 mL | Freq: Once | INTRAVENOUS | Status: AC | PRN
  Administered 2021-04-29: 12 mL via INTRAVENOUS

## 2021-04-30 ENCOUNTER — Telehealth: Payer: Self-pay | Admitting: Neurology

## 2021-04-30 DIAGNOSIS — I679 Cerebrovascular disease, unspecified: Secondary | ICD-10-CM

## 2021-04-30 DIAGNOSIS — R202 Paresthesia of skin: Secondary | ICD-10-CM

## 2021-04-30 DIAGNOSIS — I639 Cerebral infarction, unspecified: Secondary | ICD-10-CM

## 2021-04-30 NOTE — Telephone Encounter (Signed)
Pt is returning a call to jaffe about results

## 2021-05-03 ENCOUNTER — Other Ambulatory Visit: Payer: Self-pay | Admitting: Obstetrics and Gynecology

## 2021-05-03 ENCOUNTER — Telehealth: Payer: Self-pay | Admitting: Neurology

## 2021-05-03 DIAGNOSIS — R928 Other abnormal and inconclusive findings on diagnostic imaging of breast: Secondary | ICD-10-CM

## 2021-05-03 NOTE — Addendum Note (Signed)
Addended by: Leida Lauth on: 05/03/2021 02:57 PM   Modules accepted: Orders

## 2021-05-03 NOTE — Addendum Note (Signed)
Addended by: Leida Lauth on: 05/03/2021 02:04 PM   Modules accepted: Orders

## 2021-05-03 NOTE — Telephone Encounter (Signed)
Cerebellar stroke, cerebrovascular disease, paresthesias.   CTA head/Neck, Labs ordered.

## 2021-05-03 NOTE — Telephone Encounter (Signed)
Pt said she is returning a call to jaffe.

## 2021-05-03 NOTE — Telephone Encounter (Signed)
called and spoke to Alison Stevens regarding MRI results - it showed evidence of old cerebellar strokes.  I told her to start ASA 81mg  daily.  She is a former smoker and reports that she has psoriasis and possibly other autoimmune diseases.  I would like to order CTA of head and neck.  I would like to check blood work - ANA, sed rate, Sjogren's panel, pan-ANCA, lipid panel, B12 and TSH.  I would like to check echocardiogram with bubble study.

## 2021-05-03 NOTE — Telephone Encounter (Signed)
Patient spoke to [facility] please see other phone note from today.

## 2021-05-06 ENCOUNTER — Telehealth: Payer: Self-pay

## 2021-05-06 NOTE — Telephone Encounter (Signed)
No prior authorization needed for cpt 93306, [id number] . Contacted [employer].

## 2021-05-06 NOTE — Telephone Encounter (Signed)
-----   Message from Leida Lauth, [place] sent at 05/03/2021  3:26 PM EDT ----- Regarding: FW: PA# PA needed ----- Message ----- From: Minette Brine Sent: 05/03/2021   2:58 PM EDT To: Leida Lauth, CMA Subject: PA#                                            We received an order for an Echocardiogram from your Provider.  Will you please check to see if a Prior Authorization is needed for the following CPT codes: 59093, [id number], [phone].  Once PA# is completed  please document on  the order in the comment line and we will be glad to call and schedule.  The appointment will not be able to be made until the PA# is obtained. Thank you so much for your help with this.

## 2021-05-07 ENCOUNTER — Other Ambulatory Visit: Payer: Self-pay

## 2021-05-07 DIAGNOSIS — R202 Paresthesia of skin: Secondary | ICD-10-CM

## 2021-05-07 NOTE — Progress Notes (Signed)
Pt requested to have CBC and CMP checked. Per DR.Jaffe We can add a CBC and CMP

## 2021-05-12 ENCOUNTER — Encounter: Payer: BC Managed Care – PPO | Admitting: Neurology

## 2021-05-18 ENCOUNTER — Other Ambulatory Visit: Payer: Self-pay | Admitting: Obstetrics and Gynecology

## 2021-05-18 ENCOUNTER — Ambulatory Visit
Admission: RE | Admit: 2021-05-18 | Discharge: 2021-05-18 | Disposition: A | Discharge status: Home / Self Care | Payer: BC Managed Care – PPO | Source: Ambulatory Visit | Attending: Obstetrics and Gynecology | Admitting: Obstetrics and Gynecology

## 2021-05-18 ENCOUNTER — Other Ambulatory Visit: Payer: Self-pay

## 2021-05-18 DIAGNOSIS — N6489 Other specified disorders of breast: Secondary | ICD-10-CM

## 2021-05-18 DIAGNOSIS — R928 Other abnormal and inconclusive findings on diagnostic imaging of breast: Secondary | ICD-10-CM

## 2021-05-18 DIAGNOSIS — R922 Inconclusive mammogram: Secondary | ICD-10-CM | POA: Diagnosis not present

## 2021-05-24 ENCOUNTER — Ambulatory Visit
Admission: RE | Admit: 2021-05-24 | Discharge: 2021-05-24 | Disposition: A | Discharge status: Home / Self Care | Payer: BC Managed Care – PPO | Source: Ambulatory Visit | Attending: Neurology | Admitting: Neurology

## 2021-05-24 ENCOUNTER — Other Ambulatory Visit: Payer: BC Managed Care – PPO

## 2021-05-24 DIAGNOSIS — I771 Stricture of artery: Secondary | ICD-10-CM | POA: Diagnosis not present

## 2021-05-24 DIAGNOSIS — Z8673 Personal history of transient ischemic attack (TIA), and cerebral infarction without residual deficits: Secondary | ICD-10-CM | POA: Diagnosis not present

## 2021-05-24 DIAGNOSIS — R202 Paresthesia of skin: Secondary | ICD-10-CM | POA: Diagnosis not present

## 2021-05-24 DIAGNOSIS — I679 Cerebrovascular disease, unspecified: Secondary | ICD-10-CM

## 2021-05-24 DIAGNOSIS — I639 Cerebral infarction, unspecified: Secondary | ICD-10-CM

## 2021-05-24 HISTORY — DX: Cerebral infarction, unspecified: I63.9

## 2021-05-24 MED ORDER — IOPAMIDOL (ISOVUE-370) INJECTION 76%
75.0000 mL | Freq: Once | INTRAVENOUS | Status: AC | PRN
  Administered 2021-05-24: 75 mL via INTRAVENOUS

## 2021-05-27 ENCOUNTER — Ambulatory Visit (HOSPITAL_COMMUNITY): Payer: BC Managed Care – PPO | Attending: Cardiology

## 2021-05-27 ENCOUNTER — Encounter (HOSPITAL_BASED_OUTPATIENT_CLINIC_OR_DEPARTMENT_OTHER): Payer: Self-pay

## 2021-05-27 ENCOUNTER — Other Ambulatory Visit: Payer: Self-pay

## 2021-05-27 DIAGNOSIS — I639 Cerebral infarction, unspecified: Secondary | ICD-10-CM | POA: Insufficient documentation

## 2021-05-27 DIAGNOSIS — R202 Paresthesia of skin: Secondary | ICD-10-CM | POA: Diagnosis not present

## 2021-05-27 DIAGNOSIS — I679 Cerebrovascular disease, unspecified: Secondary | ICD-10-CM | POA: Insufficient documentation

## 2021-05-27 LAB — ECHOCARDIOGRAM COMPLETE BUBBLE STUDY
Area-P 1/2: 3.85 cm2
MV M vel: 5.28 m/s
MV Peak grad: 111.3 mmHg
Radius: 0.4 cm
S' Lateral: 2.8 cm

## 2021-05-27 MED ORDER — NORMAL SALINE NICU FLUSH: 0.5000 mL | INTRAVENOUS | Status: AC | PRN

## 2021-05-28 ENCOUNTER — Telehealth: Payer: Self-pay | Admitting: Neurology

## 2021-05-28 ENCOUNTER — Telehealth: Payer: Self-pay | Admitting: Cardiology

## 2021-05-28 NOTE — Telephone Encounter (Signed)
Pt said sheena lm about her CTA results, just returning call

## 2021-05-28 NOTE — Telephone Encounter (Signed)
Pt is requesting a TEE

## 2021-05-28 NOTE — Progress Notes (Signed)
Left message to call office for results. 05/28/2021 at 11:02

## 2021-05-28 NOTE — Telephone Encounter (Signed)
See My chart message

## 2021-05-31 ENCOUNTER — Other Ambulatory Visit: Payer: Self-pay

## 2021-05-31 ENCOUNTER — Ambulatory Visit
Admission: RE | Admit: 2021-05-31 | Discharge: 2021-05-31 | Disposition: A | Discharge status: Home / Self Care | Payer: BC Managed Care – PPO | Source: Ambulatory Visit | Attending: Obstetrics and Gynecology | Admitting: Obstetrics and Gynecology

## 2021-05-31 DIAGNOSIS — D0502 Lobular carcinoma in situ of left breast: Secondary | ICD-10-CM | POA: Diagnosis not present

## 2021-05-31 DIAGNOSIS — N6489 Other specified disorders of breast: Secondary | ICD-10-CM

## 2021-05-31 DIAGNOSIS — R928 Other abnormal and inconclusive findings on diagnostic imaging of breast: Secondary | ICD-10-CM | POA: Diagnosis not present

## 2021-05-31 DIAGNOSIS — R9389 Abnormal findings on diagnostic imaging of other specified body structures: Secondary | ICD-10-CM

## 2021-06-01 NOTE — Telephone Encounter (Signed)
Patient returned your call.

## 2021-06-01 NOTE — Telephone Encounter (Signed)
Called pt to schedule an appointment to discuss further. Left message to call back.

## 2021-06-01 NOTE — Telephone Encounter (Signed)
Spoke with pt and scheduled an appointment with Dr. Cristal Deer on 11/8 to discuss further.

## 2021-06-16 ENCOUNTER — Encounter: Payer: Self-pay | Admitting: Pulmonary Disease

## 2021-06-16 ENCOUNTER — Ambulatory Visit (INDEPENDENT_AMBULATORY_CARE_PROVIDER_SITE_OTHER): Payer: BC Managed Care – PPO | Admitting: Pulmonary Disease

## 2021-06-16 ENCOUNTER — Other Ambulatory Visit: Payer: Self-pay

## 2021-06-16 VITALS — BP 94/62 | HR 77 | Temp 98.3°F | Ht 66.0 in | Wt 134.2 lb

## 2021-06-16 DIAGNOSIS — R0609 Other forms of dyspnea: Secondary | ICD-10-CM | POA: Diagnosis not present

## 2021-06-16 MED ORDER — ALBUTEROL SULFATE HFA 108 (90 BASE) MCG/ACT IN AERS: 2.0000 | INHALATION_SPRAY | Freq: Four times a day (QID) | RESPIRATORY_TRACT | 2 refills | Status: DC | PRN

## 2021-06-16 NOTE — Progress Notes (Signed)
@Patient  ID: Alison Stevens, female    DOB: 02-15-1981, 40 y.o.   MRN: [id number]  Chief Complaint  Patient presents with   Consult    Pt states CT showed scarring    Referring provider: Drema Dallas, DO  HPI:   40 y.o. woman whom we are seeing in consultation for evaluation of "abnormal CT" and dyspnea.  PCP note reviewed.  Recent neurology note reviewed.  Overall she feels okay.  Shortness of breath with daily activities of living and independent activities of living.  She is an avid exerciser.  Uses a palatine.  Couple years ago was able to do 200 of work on palatine output.  Over the last couple years limited about 180.  This is on similar rides of same length, 30 minutes.  She thinks is due to getting older.  She has had work-up for multiple things over the last few months.  Reason for referral is a CT angio head and neck 05/24/2021 there is report of mild apical pleural parenchymal scarring.  I reviewed these images in great detail multiple times on both axial, sagittal, coronal cuts.  On the axial cuts there is haziness just cranial to lung fields that I favor as interpretation to represent apical pleura and not scarring.  I do not see evidence of scarring and any other cut review.  Would expect to see similar findings on every cut or view if scarring or parenchymal abnormality was present.  Discussed this at length with patient.  No further follow-up needed.  Returns with dyspnea, does endorse concomitant seasonal allergies.  Dyspnea her breathing feels heavy or more labored when she returns to [place] from frequent trips to [place].  She will use over-the-counter allergy medications when she has allergy flares with change of seasons.  PMH: Possible CVA, seasonal allergies Surgical history: Plan lumpectomy 06/2021 Family history: First relatives emphysema, CAD Social history: Quit smoking in 2009, 10 to 15-year smoking history of 1-1/2 packs a day or so.  Lives in  [place].  Spends significant part of time of the year in [place].  Questionaires / Pulmonary Flowsheets:   ACT:  No flowsheet data found.  MMRC: No flowsheet data found.  Epworth:  No flowsheet data found.  Tests:   FENO:  No results found for: NITRICOXIDE  PFT: No flowsheet data found.  WALK:  No flowsheet data found.  Imaging: Personally reviewed and as per EMR discussion this note CT ANGIO HEAD NECK W WO CM  Result Date: 05/25/2021 CLINICAL DATA:  Cerebellar Stroke, cerebrovascular disease, paresthesias EXAM: CT ANGIOGRAPHY HEAD AND NECK TECHNIQUE: Multidetector CT imaging of the head and neck was performed using the standard protocol during bolus administration of intravenous contrast. Multiplanar CT image reconstructions and MIPs were obtained to evaluate the vascular anatomy. Carotid stenosis measurements (when applicable) are obtained utilizing NASCET criteria, using the distal internal carotid diameter as the denominator. CONTRAST:  66mL ISOVUE-370 IOPAMIDOL (ISOVUE-370) INJECTION 76% COMPARISON:  MRI 04/29/2021.  CT head 10/01/2020. FINDINGS: CT HEAD FINDINGS Brain: No evidence of acute large vascular territory infarction, hemorrhage, hydrocephalus, extra-axial collection or mass lesion/mass effect. Small bilateral remote cerebellar lacunar infarcts, better seen on prior MRI. Vascular: See below. Skull: No acute fracture. Sinuses: Clear visualized sinuses. Orbits: No acute findings. Review of the MIP images confirms the above findings CTA NECK FINDINGS Aortic arch: Great vessel origins are patent. Right carotid system: No evidence of dissection, stenosis (50% or greater) or occlusion. Mildly tortuous ICA. Left carotid system:  No evidence of dissection, stenosis (50% or greater) or occlusion. Vertebral arteries: Codominant. No evidence of dissection, stenosis (50% or greater) or occlusion. Skeleton: No evidence of acute abnormality. Reversal of the normal cervical lordosis.  Other neck: Scattered nonspecific prominent, but nonenlarged cervical chain lymph nodes bilaterally. Upper chest: Mild biapical pleuroparenchymal scarring. Otherwise, visualized lung apices are clear. Review of the MIP images confirms the above findings CTA HEAD FINDINGS Anterior circulation: Bilateral intracranial ICAs, MCAs, and ACAs are patent without proximal hemodynamically significant stenosis. No aneurysm identified. Posterior circulation: Bilateral intradural vertebral arteries, basilar artery and posterior cerebral arteries are patent without proximal hemodynamically significant stenosis. Bilateral PICAs are patent proximally. No aneurysm identified. Venous sinuses: As permitted by contrast timing, patent. Review of the MIP images confirms the above findings IMPRESSION: 1. No evidence of acute intracranial abnormality. 2. No large vessel occlusion or proximal hemodynamically significant stenosis in the head or neck. 3. Scattered nonspecific prominent, but nonenlarged cervical chain lymph nodes bilaterally. Electronically Signed   By: Feliberto Harts M.D.   On: 05/25/2021 12:22   US BREAST LTD UNI LEFT INC AXILLA  Result Date: 05/18/2021 CLINICAL DATA:  Patient returns after baseline screening study for evaluation possible LEFT breast distortion. EXAM: DIGITAL DIAGNOSTIC UNILATERAL LEFT MAMMOGRAM WITH TOMOSYNTHESIS AND CAD; ULTRASOUND LEFT BREAST LIMITED TECHNIQUE: Left digital diagnostic mammography and breast tomosynthesis was performed. The images were evaluated with computer-aided detection.; Targeted ultrasound examination of the left breast was performed. COMPARISON:  Baseline mammogram 04/27/2021 ACR Breast Density Category c: The breast tissue is heterogeneously dense, which may obscure small masses. FINDINGS: Additional 2-D and 3-D images are performed. These views confirm presence of distortion in the LATERAL portion of the LEFT breast. On physical exam, I palpate no abnormality in the  LATERAL portion of the LEFT breast. Targeted ultrasound is performed, showing normal appearing fibroglandular tissue in the LATERAL aspect of the LEFT breast. Evaluation of the LEFT is negative for adenopathy. IMPRESSION: Persistent distortion in the LATERAL aspect of the LEFT breast, without sonographic correlate. RECOMMENDATION: Recommend stereotactic biopsy of the LEFT breast. I have discussed the findings and recommendations with the patient. If applicable, a reminder letter will be sent to the patient regarding the next appointment. BI-RADS CATEGORY  4: Suspicious. Electronically Signed   By: Norva Pavlov M.D.   On: 05/18/2021 16:24  MM DIAG BREAST TOMO UNI LEFT  Result Date: 05/18/2021 CLINICAL DATA:  Patient returns after baseline screening study for evaluation possible LEFT breast distortion. EXAM: DIGITAL DIAGNOSTIC UNILATERAL LEFT MAMMOGRAM WITH TOMOSYNTHESIS AND CAD; ULTRASOUND LEFT BREAST LIMITED TECHNIQUE: Left digital diagnostic mammography and breast tomosynthesis was performed. The images were evaluated with computer-aided detection.; Targeted ultrasound examination of the left breast was performed. COMPARISON:  Baseline mammogram 04/27/2021 ACR Breast Density Category c: The breast tissue is heterogeneously dense, which may obscure small masses. FINDINGS: Additional 2-D and 3-D images are performed. These views confirm presence of distortion in the LATERAL portion of the LEFT breast. On physical exam, I palpate no abnormality in the LATERAL portion of the LEFT breast. Targeted ultrasound is performed, showing normal appearing fibroglandular tissue in the LATERAL aspect of the LEFT breast. Evaluation of the LEFT is negative for adenopathy. IMPRESSION: Persistent distortion in the LATERAL aspect of the LEFT breast, without sonographic correlate. RECOMMENDATION: Recommend stereotactic biopsy of the LEFT breast. I have discussed the findings and recommendations with the patient. If applicable, a  reminder letter will be sent to the patient regarding the next appointment. BI-RADS CATEGORY  4: Suspicious. Electronically Signed   By: Norva Pavlov M.D.   On: 05/18/2021 16:24  ECHOCARDIOGRAM COMPLETE BUBBLE STUDY  Result Date: 05/27/2021    ECHOCARDIOGRAM REPORT   Patient Name:   Alison Stevens Date of Exam: 05/27/2021 Medical Rec #:  [id number]         Height:       66.0 in Accession #:    [id number]        Weight:       135.2 lb Date of Birth:  03-03-1981         BSA:          1.693 m Patient Age:    40 years          BP:           123/85 mmHg Patient Gender: F                 HR:           78 bpm. Exam Location:  [address] Procedure: 2D Echo, Cardiac Doppler, Color Doppler and Saline Contrast Bubble            Study Indications:    I63.9 Stroke  History:        Patient has no prior history of Echocardiogram examinations.                 Cerebrovascular disease. Atypical chest pain.  Sonographer:    Cathie Beams RCS Referring Phys: [id number] ADAM R JAFFE IMPRESSIONS  1. Left ventricular ejection fraction, by estimation, is 60 to 65%. The left ventricle has normal function. The left ventricle has no regional wall motion abnormalities. Left ventricular diastolic parameters were normal.  2. Right ventricular systolic function is normal. The right ventricular size is normal.  3. MR is eccentric, directed posterior into LA It is moderate at least Consider TEE to further define valve function, MR severity. . Mild mitral valve regurgitation.  4. The aortic valve is normal in structure. Aortic valve regurgitation is not visualized.  5. The inferior vena cava is normal in size with greater than 50% respiratory variability, suggesting right atrial pressure of 3 mmHg.  6. Agitated saline contrast bubble study was negative, with no evidence of any interatrial shunt. FINDINGS  Left Ventricle: Left ventricular ejection fraction, by estimation, is 60 to 65%. The left ventricle has normal function. The left  ventricle has no regional wall motion abnormalities. The left ventricular internal cavity size was normal in size. There is  no left ventricular hypertrophy. Left ventricular diastolic parameters were normal. Right Ventricle: The right ventricular size is normal. Right vetricular wall thickness was not assessed. Right ventricular systolic function is normal. Left Atrium: Left atrial size was normal in size. Right Atrium: Right atrial size was normal in size. Pericardium: There is no evidence of pericardial effusion. Mitral Valve: MR is eccentric, directed posterior into LA It is moderate at least Consider TEE to further define valve function, MR severity. There is mild thickening of the mitral valve leaflet(s). Mild mitral valve regurgitation. Tricuspid Valve: The tricuspid valve is normal in structure. Tricuspid valve regurgitation is trivial. Aortic Valve: The aortic valve is normal in structure. Aortic valve regurgitation is not visualized. Pulmonic Valve: The pulmonic valve was normal in structure. Pulmonic valve regurgitation is trivial. Aorta: The aortic root and ascending aorta are structurally normal, with no evidence of dilitation. Venous: The inferior vena cava is normal in size with greater than 50% respiratory variability, suggesting right  atrial pressure of 3 mmHg. IAS/Shunts: No atrial level shunt detected by color flow Doppler. Agitated saline contrast was given intravenously to evaluate for intracardiac shunting. Agitated saline contrast bubble study was negative, with no evidence of any interatrial shunt.  LEFT VENTRICLE PLAX 2D LVIDd:         4.00 cm   Diastology LVIDs:         2.80 cm   LV e' medial:    10.80 cm/s LV PW:         0.90 cm   LV E/e' medial:  10.5 LV IVS:        0.90 cm   LV e' lateral:   15.00 cm/s LVOT diam:     1.95 cm   LV E/e' lateral: 7.5 LV SV:         61 LV SV Index:   36 LVOT Area:     2.99 cm  RIGHT VENTRICLE RV Basal diam:  2.00 cm RV S prime:     14.80 cm/s TAPSE  (M-mode): 2.2 cm LEFT ATRIUM             Index        RIGHT ATRIUM           Index LA diam:        3.30 cm 1.95 cm/m   RA Area:     10.10 cm LA Vol (A2C):   33.2 ml 19.61 ml/m  RA Volume:   19.00 ml  11.22 ml/m LA Vol (A4C):   39.0 ml 23.03 ml/m LA Biplane Vol: 37.4 ml 22.09 ml/m  AORTIC VALVE LVOT Vmax:   99.60 cm/s LVOT Vmean:  65.000 cm/s LVOT VTI:    0.203 m  AORTA Ao Root diam: 3.20 cm Ao Asc diam:  2.80 cm MITRAL VALVE MV Area (PHT): 3.85 cm       SHUNTS MV Decel Time: 197 msec       Systemic VTI:  0.20 m MR Peak grad:    111.3 mmHg   Systemic Diam: 1.95 cm MR Mean grad:    81.0 mmHg MR Vmax:         527.50 cm/s MR Vmean:        428.5 cm/s MR PISA:         1.01 cm MR PISA Eff ROA: 7 mm MR PISA Radius:  0.40 cm MV E velocity: 113.00 cm/s MV A velocity: 97.50 cm/s MV E/A ratio:  1.16 Dietrich Pates MD Electronically signed by Dietrich Pates MD Signature Date/Time: 05/27/2021/12:40:54 PM    Final    MM CLIP PLACEMENT LEFT  Result Date: 05/31/2021 CLINICAL DATA:  Evaluate RIBBON biopsy clip placement following 3D/stereotactic guided LEFT breast biopsy. EXAM: 3D DIAGNOSTIC LEFT MAMMOGRAM POST STEREOTACTIC BIOPSY COMPARISON:  Previous exam(s). FINDINGS: 3D Mammographic images were obtained following 3D/stereotactic guided biopsy of OUTER LEFT breast distortion. The RIBBON biopsy marking clip is in expected position at the site of biopsy. IMPRESSION: Appropriate positioning of the RIBBON shaped biopsy marking clip at the site of biopsy in the OUTER LEFT breast. Final Assessment: Post Procedure Mammograms for Marker Placement Electronically Signed   By: Harmon Pier M.D.   On: 05/31/2021 12:49  MM LT BREAST BX W LOC DEV 1ST LESION IMAGE BX SPEC STEREO GUIDE  Addendum Date: 06/03/2021   ADDENDUM REPORT: 06/03/2021 08:35 ADDENDUM: Pathology revealed COMPLEX SCLEROSING LESION WITH LOBULAR NEOPLASIA (LOBULAR CARCINOMA IN SITU AND ATYPICAL LOBULAR HYPERPLASIA) of the LEFT breast, outer, ribbon clip. This was  found  to be concordant by Dr. Laveda Abbe, with surgical consultation for consideration of excision recommended. Pathology results were discussed with the patient by telephone. The patient reported doing well after the biopsy with tenderness and minimal bleeding at the site. Post biopsy instructions and care were reviewed and questions were answered. The patient was encouraged to call [facility] for any additional concerns. Surgical consultation has been arranged with Dr. Chevis Pretty at [facility] on June 18, 2021. Pathology results reported by Collene Mares RN on 06/03/2021. Electronically Signed   By: Harmon Pier M.D.   On: 06/03/2021 08:35   Result Date: 06/03/2021 CLINICAL DATA:  40 year old female for tissue sampling of OUTER LEFT breast distortion. EXAM: LEFT BREAST STEREOTACTIC CORE NEEDLE BIOPSY COMPARISON:  Previous exams. FINDINGS: The patient and I discussed the procedure of stereotactic-guided biopsy including benefits and alternatives. We discussed the high likelihood of a successful procedure. We discussed the risks of the procedure including infection, bleeding, tissue injury, clip migration, and inadequate sampling. Informed written consent was given. The usual time out protocol was performed immediately prior to the procedure. Using sterile technique and 1% Lidocaine with and without epinephrine as local anesthetic, under stereotactic guidance, a 9 gauge vacuum assisted device was used to perform core needle biopsy of OUTER LEFT breast distortion using a LATERAL approach. Specimen radiograph was performed. At the conclusion of the procedure, a RIBBON shaped tissue marker clip was deployed into the biopsy cavity. Follow-up 2-view mammogram was performed and dictated separately. IMPRESSION: Stereotactic-guided biopsy of OUTER LEFT breast distortion. No apparent complications. Electronically Signed: By: Harmon Pier M.D. On: 05/31/2021 12:21   Lab  Results: Personally reviewed CBC    Component Value Date/Time   WBC 5.8 11/26/2019 1338   RBC 4.24 11/26/2019 1338   HGB 13.2 11/26/2019 1338   HCT 39.0 11/26/2019 1338   PLT 248.0 11/26/2019 1338   MCV 92.1 11/26/2019 1338   MCH 30.6 08/31/2017 1644   MCHC 33.8 11/26/2019 1338   RDW 12.5 11/26/2019 1338    BMET    Component Value Date/Time   NA 137 11/26/2019 1338   K 4.0 11/26/2019 1338   CL 104 11/26/2019 1338   CO2 26 11/26/2019 1338   GLUCOSE 91 11/26/2019 1338   BUN 11 11/26/2019 1338   CREATININE 0.71 11/26/2019 1338   CALCIUM 9.4 11/26/2019 1338    BNP No results found for: BNP  ProBNP No results found for: PROBNP  Specialty Problems   None   Allergies  Allergen Reactions   Vicodin [Hydrocodone-Acetaminophen] Nausea And Vomiting    Immunization History  Administered Date(s) Administered   Influenza-Unspecified 05/23/2019   PFIZER Comirnaty(Gray Top)Covid-19 Tri-Sucrose Vaccine 10/12/2019, 11/09/2019, 08/17/2020    Past Medical History:  Diagnosis Date   H/O cold sores    SVD (spontaneous vaginal delivery)    x 2    Tobacco History: Social History   Tobacco Use  Smoking Status Former   Packs/day: 0.25   Years: 4.00   Pack years: 1.00   Types: Cigarettes  Smokeless Tobacco Never  Tobacco Comments   Smoked in [employer] - 4 yrs   Counseling given: Not Answered Tobacco comments: Smoked in [employer] - 4 yrs   Continue to not smoke  Outpatient Encounter Medications as of 06/16/2021  Medication Sig   albuterol (VENTOLIN HFA) 108 (90 Base) MCG/ACT inhaler Inhale 2 puffs into the lungs every 6 (six) hours as needed for wheezing or shortness of breath.  ALPRAZolam (XANAX) 0.25 MG tablet Take 0.25 mg by mouth daily as needed.   aspirin-acetaminophen-caffeine (EXCEDRIN MIGRAINE) 250-250-65 MG tablet Take by mouth every 6 (six) hours as needed for headache.   Ibuprofen (ADVIL PO) Take by mouth.   ALPRAZolam (XANAX) 0.5 MG tablet Take one  tablet daily as needed for anxiety. (Patient not taking: No sig reported)   Norgestim-Eth Estrad Triphasic (TRI-SPRINTEC PO) Take by mouth.   nortriptyline (PAMELOR) 10 MG capsule Take 1 capsule (10 mg total) by mouth at bedtime. (Patient not taking: Reported on 06/16/2021)   SUMAtriptan (IMITREX) 25 MG tablet Take 1 tablet (25 mg total) by mouth every 2 (two) hours as needed for migraine. May repeat in 2 hours if headache persists or recurs. No more than  in 24hrs (Patient not taking: Reported on 06/16/2021)   Facility-Administered Encounter Medications as of 06/16/2021  Medication   normal saline NICU flush     Review of Systems  Review of Systems  No chest.  Exertion.  No orthopnea or PND.  No lower extremity swelling.  Comprehensive review of systems otherwise negative. Physical Exam  BP 94/62 (BP Location: Left Arm, Patient Position: Sitting, Cuff Size: Normal)   Pulse 77   Temp 98.3 F (36.8 C) (Oral)   Ht  (1.676 m)   Wt 134 lb 3.2 oz (60.9 kg)   SpO2 98%   BMI 21.66 kg/m   Wt Readings from Last 5 Encounters:  06/16/21 134 lb 3.2 oz (60.9 kg)  04/08/21 135 lb 3.2 oz (61.3 kg)  09/29/20 129 lb 12.8 oz (58.9 kg)  11/26/19 127 lb 9.6 oz (57.9 kg)  10/18/19 136 lb (61.7 kg)    BMI Readings from Last 5 Encounters:  06/16/21 21.66 kg/m  04/08/21 21.82 kg/m  09/29/20 20.95 kg/m  11/26/19 20.60 kg/m  10/18/19 21.95 kg/m     Physical Exam General: Well-appearing, no acute distress Eyes: EOMI, icterus Neck: Supple, no JVP Pulmonary: Clear, normal work of breathing Cardiovascular: Regular rate and rhythm, no murmur Abdomen: Nondistended, bowel sounds present MSK: No synovitis, no joint effusion Neuro: Normal gait, no weakness Psych: Normal mood, full affect   Assessment & Plan:   Abnormal CT scan: Reviewed portion of lung seen on recent CTA head and neck at length.  I did not see any abnormality.  On axial images there is on my interpretation  visualization of the apical pleura but I presume is interpreted as scar in the report.  I do not see similar findings on any other slice review to be consistent with parenchymal abnormality.  I think the lungs are quite normal on CT scan.  No need for additional follow-up.  Dyspnea on exertion: And nocturnal shortness of breath.  Atopic symptoms, worsening breathing when change of environment in [place].  Highly suspicious for asthma.  PFTs for further evaluation.  Trial albuterol prior to exertion to see if that helps.  Suspect would benefit from ICS/LABA in the future.   Return in about 3 months (around 09/16/2021).   Karren Burly, MD 06/16/2021

## 2021-06-16 NOTE — Patient Instructions (Addendum)
Nice to meet you  I think the part of the lungs seen on CT scan look normal.  I am not worried about scarring.  I do not see any scarring.  If there is any present it is very tiny and not significant.  To help with some of your shortness of breath for dyspnea on exertion particular with exercise and at night, I recommend using albuterol 2 puffs about 20 minutes before exercise.  See if this helps with your exertional capacity, increase the amount of output on your [employer] workouts.  To further evaluate some of the shortness of breath, I recommend pulmonary function test.  These can be scheduled at your convenience.  I recommend you do them prior to your surgery or at the very end of the year to allow for healing so that we do not get false results due to any pain or discomfort.  Based on results of those tests and your response to albuterol, I suspect will recommend using a different inhaler every day given my suspicion for asthma.  Return to clinic for follow-up with Dr. Judeth Horn in 3 months or sooner as needed

## 2021-06-18 DIAGNOSIS — D0502 Lobular carcinoma in situ of left breast: Secondary | ICD-10-CM | POA: Diagnosis not present

## 2021-06-22 ENCOUNTER — Ambulatory Visit: Payer: Self-pay | Admitting: General Surgery

## 2021-06-22 ENCOUNTER — Ambulatory Visit (HOSPITAL_BASED_OUTPATIENT_CLINIC_OR_DEPARTMENT_OTHER): Payer: BC Managed Care – PPO | Admitting: Cardiology

## 2021-06-22 DIAGNOSIS — D0502 Lobular carcinoma in situ of left breast: Secondary | ICD-10-CM

## 2021-06-25 ENCOUNTER — Telehealth (HOSPITAL_BASED_OUTPATIENT_CLINIC_OR_DEPARTMENT_OTHER): Payer: Self-pay

## 2021-06-25 NOTE — Telephone Encounter (Signed)
Called pt to change virtual appointment on 11/16 to in person. Left message to call back.

## 2021-06-25 NOTE — Telephone Encounter (Signed)
Pt spoke to scheduler and changed appointment to in person.

## 2021-06-28 ENCOUNTER — Telehealth: Payer: Self-pay | Admitting: Hematology and Oncology

## 2021-06-28 NOTE — Telephone Encounter (Signed)
Scheduled appt per 11/14 referral. Pt is aware of appt date and time.  

## 2021-06-29 ENCOUNTER — Other Ambulatory Visit: Payer: Self-pay | Admitting: General Surgery

## 2021-06-29 DIAGNOSIS — D0502 Lobular carcinoma in situ of left breast: Secondary | ICD-10-CM

## 2021-06-30 ENCOUNTER — Other Ambulatory Visit (INDEPENDENT_AMBULATORY_CARE_PROVIDER_SITE_OTHER): Payer: BC Managed Care – PPO

## 2021-06-30 ENCOUNTER — Telehealth: Payer: Self-pay | Admitting: Neurology

## 2021-06-30 ENCOUNTER — Ambulatory Visit (INDEPENDENT_AMBULATORY_CARE_PROVIDER_SITE_OTHER): Payer: BC Managed Care – PPO | Admitting: Cardiology

## 2021-06-30 ENCOUNTER — Other Ambulatory Visit: Payer: Self-pay

## 2021-06-30 VITALS — BP 102/70 | HR 94 | Ht 66.0 in | Wt 131.6 lb

## 2021-06-30 DIAGNOSIS — I679 Cerebrovascular disease, unspecified: Secondary | ICD-10-CM

## 2021-06-30 DIAGNOSIS — Z712 Person consulting for explanation of examination or test findings: Secondary | ICD-10-CM | POA: Diagnosis not present

## 2021-06-30 DIAGNOSIS — I639 Cerebral infarction, unspecified: Secondary | ICD-10-CM

## 2021-06-30 DIAGNOSIS — Z7189 Other specified counseling: Secondary | ICD-10-CM

## 2021-06-30 DIAGNOSIS — I340 Nonrheumatic mitral (valve) insufficiency: Secondary | ICD-10-CM

## 2021-06-30 DIAGNOSIS — R202 Paresthesia of skin: Secondary | ICD-10-CM

## 2021-06-30 LAB — COMPREHENSIVE METABOLIC PANEL
ALT: 7 U/L (ref 0–35)
AST: 15 U/L (ref 0–37)
Albumin: 4.9 g/dL (ref 3.5–5.2)
Alkaline Phosphatase: 46 U/L (ref 39–117)
BUN: 12 mg/dL (ref 6–23)
CO2: 24 mEq/L (ref 19–32)
Calcium: 9.6 mg/dL (ref 8.4–10.5)
Chloride: 105 mEq/L (ref 96–112)
Creatinine, Ser: 0.81 mg/dL (ref 0.40–1.20)
GFR: 90.61 mL/min (ref >60.00)
Glucose, Bld: 92 mg/dL (ref 70–99)
Potassium: 4.5 mEq/L (ref 3.5–5.1)
Sodium: 139 mEq/L (ref 135–145)
Total Bilirubin: 0.4 mg/dL (ref 0.2–1.2)
Total Protein: 8 g/dL (ref 6.0–8.3)

## 2021-06-30 LAB — CBC
HCT: 39.3 % (ref 36.0–46.0)
Hemoglobin: 13.1 g/dL (ref 12.0–15.0)
MCHC: 33.4 g/dL (ref 30.0–36.0)
MCV: 90.7 fl (ref 78.0–100.0)
Platelets: 223 10*3/uL (ref 150.0–400.0)
RBC: 4.34 Mil/uL (ref 3.87–5.11)
RDW: 12.3 % (ref 11.5–15.5)
WBC: 5.1 10*3/uL (ref 4.0–10.5)

## 2021-06-30 LAB — SEDIMENTATION RATE: Sed Rate: 11 mm/hr (ref 0–20)

## 2021-06-30 LAB — VITAMIN B12: Vitamin B-12: 217 pg/mL (ref 211–911)

## 2021-06-30 LAB — TSH: TSH: 1.5 u[IU]/mL (ref 0.35–5.50)

## 2021-06-30 NOTE — Patient Instructions (Signed)
Medication Instructions: Your Physician recommend you continue on your current medication as directed.    *If you need a refill on your cardiac medications before your next appointment, please call your pharmacy*   Lab Work: Please have primary care fax lab work to [phone]   Testing/Procedures: Your physician has requested that you have a TEE. During a TEE, sound waves are used to create images of your heart. It provides your doctor with information about the size and shape of your heart and how well your heart's chambers and valves are working. In this test, a transducer is attached to the end of a flexible tube that's guided down your throat and into your esophagus (the tube leading from you mouth to your stomach) to get a more detailed image of your heart. You are not awake for the procedure. Please see the instruction sheet given to you today. For further information please visit [email or web address]    Follow-Up: At [facility], you and your health needs are our priority.  As part of our continuing mission to provide you with exceptional heart care, we have created designated Provider Care Teams.  These Care Teams include your primary Cardiologist (physician) and Advanced Practice Providers (APPs -  Physician Assistants and Nurse Practitioners) who all work together to provide you with the care you need, when you need it.  We recommend signing up for the patient portal called "MyChart".  Sign up information is provided on this After Visit Summary.  MyChart is used to connect with patients for Virtual Visits (Telemedicine).  Patients are able to view lab/test results, encounter notes, upcoming appointments, etc.  Non-urgent messages can be sent to your provider as well.   To learn more about what you can do with MyChart, go to [email or web address].    Your next appointment:   Based on test results  The format for your next appointment:   In  Person  Provider:   [name], MD    You are scheduled for a TEE on Wednesday 07/21/21 with Dr. Royann Shivers.  Please arrive at the [facility] (Main Entrance A) at [facility]: [address] [place], [place] [address] at 8 am  DIET: Nothing to eat or drink after midnight except a sip of water with medications (see medication instructions below)  FYI: For your safety, and to allow [place] to monitor your vital signs accurately during the surgery/procedure we request that   if you have artificial nails, gel coating, SNS etc. Please have those removed prior to your surgery/procedure. Not having the nail coverings /polish removed may result in cancellation or delay of your surgery/procedure.   Medication Instructions:    Ok to take medication as prescribed    Labs: Please have primary care fax lab results to [phone]  You must have a responsible person to drive you home and stay in the waiting area during your procedure. Failure to do so could result in cancellation.  Bring your insurance cards.  *Special Note: Every effort is made to have your procedure done on time. Occasionally there are emergencies that occur at the hospital that may cause delays. Please be patient if a delay does occur.

## 2021-06-30 NOTE — H&P (View-Only) (Signed)
Cardiology Office Note:    Date:  06/30/2021   ID:  [name], DOB 1981-02-15, MRN [id number]  PCP:  Pcp, No  Cardiologist:  Buford Dresser, MD  CC: follow up  History of Present Illness:    Alison Stevens is a 40 y.o. female with a hx of anxiety who is seen for follow up today. I initially met her 10/18/2019 as a new consult at the request of Dr. Letta Median for the evaluation and management of chest pain.  Today: She presents today to discuss and review her recent TTE. This showed eccentric MR.  Her sister is an Research officer, trade union, comes with a list of questions: -does she need a TEE for further evaluation? We discussed today, see below -will this eventually need to be repaired/replaced? Discussed mitral valve at length, when to repair/replace -found to have three tiny strokes on MRI. Could this explain that? Discussed that MRI unfortunately cannot tell us the cause of strokes or when specifically they occurred. -can I exercise normally? Yes. Exercises routinely, intense workouts 3x/week. Can get heart rates up to 170s on the [employer] routinely. Sometimes can't get to the level but typically can do it on the [employer]. Hasn't been able to hit her PR in the last two years. Takes the summers off. -is there anything to look for to know if this is getting dangerous? Discussed heart failure symptoms today. Denies orthopnea, LE edema. Has some shortness of breath at night, improves with taking deep breaths. Has to sit up sometimes with chest wall pain but not due to breathing. Pain ranges from shoulders to abdomen about 5 of 7 nights/week, also with leg twitching. -do I need any medications for this? No.  I personally reviewed her images. Had high fevers as an adult and child, most recently during Covid this year and influenza in 2020. Has had a staph infection in her early 81s. Was told in the past not to take Dayquil because of her heart.   She wants to be proactive and get  as much information as she can. Discussed TEE today, see below, she is amenable.  Past Medical History:  Diagnosis Date   H/O cold sores    SVD (spontaneous vaginal delivery)    x 2    Past Surgical History:  Procedure Laterality Date   DILATION AND EVACUATION N/A 08/31/2017   Procedure: DILATATION AND EVACUATION;  Surgeon: Everlene Farrier, MD;  Location: [facility];  Service: Gynecology;  Laterality: N/A;   WISDOM TOOTH EXTRACTION      Current Medications: Current Outpatient Medications on File Prior to Visit  Medication Sig   aspirin-acetaminophen-caffeine (EXCEDRIN MIGRAINE) 250-250-65 MG tablet Take by mouth every 6 (six) hours as needed for headache.   doxylamine, Sleep, (UNISOM SLEEPTABS) 25 MG tablet Take 25 mg by mouth at bedtime as needed.   Ibuprofen (ADVIL PO) Take by mouth.   albuterol (VENTOLIN HFA) 108 (90 Base) MCG/ACT inhaler Inhale 2 puffs into the lungs every 6 (six) hours as needed for wheezing or shortness of breath. (Patient not taking: Reported on 06/30/2021)   ALPRAZolam (XANAX) 0.25 MG tablet Take 0.25 mg by mouth daily as needed.   Current Facility-Administered Medications on File Prior to Visit  Medication   normal saline NICU flush     Allergies:   Vicodin [hydrocodone-acetaminophen]   Social History   Tobacco Use   Smoking status: Former    Packs/day: 0.25    Years: 4.00    Pack years: 1.00  Types: Cigarettes   Smokeless tobacco: Never   Tobacco comments:    Smoked in [employer] - 4 yrs  Vaping Use   Vaping Use: Never used  Substance Use Topics   Alcohol use: Yes    Comment: occ has 4-5 drinks over a 12 hour period.    Drug use: No    Family History: family history includes Dementia in her paternal grandmother; Migraines in her sister; Seizures in an other family member; Stroke in her paternal grandmother.  ROS:   Please see the history of present illness.  Additional pertinent ROS otherwise unremarkable.  EKGs/Labs/Other  Studies Reviewed:    The following studies were reviewed today: Echo 05/27/21  1. Left ventricular ejection fraction, by estimation, is 60 to 65%. The  left ventricle has normal function. The left ventricle has no regional  wall motion abnormalities. Left ventricular diastolic parameters were  normal.   2. Right ventricular systolic function is normal. The right ventricular  size is normal.   3. MR is eccentric, directed posterior into LA It is moderate at least  Consider TEE to further define valve function, MR severity. . Mild mitral  valve regurgitation.   4. The aortic valve is normal in structure. Aortic valve regurgitation is  not visualized.   5. The inferior vena cava is normal in size with greater than 50%  respiratory variability, suggesting right atrial pressure of 3 mmHg.   6. Agitated saline contrast bubble study was negative, with no evidence  of any interatrial shunt.   EKG:  EKG is personally reviewed.   10/18/19: NSR  Recent Labs: No results found for requested labs within last 8760 hours.  Recent Lipid Panel    Component Value Date/Time   CHOL 217 (H) 11/26/2019 1338   TRIG 102.0 11/26/2019 1338   HDL 56.90 11/26/2019 1338   CHOLHDL 4 11/26/2019 1338   VLDL 20.4 11/26/2019 1338   LDLCALC 139 (H) 11/26/2019 1338   LDLDIRECT 134.0 06/21/2018 1353    Physical Exam:    VS:  BP 102/70   Pulse 94   Ht _0  (1.676 m)   Wt 131 lb 9.6 oz (59.7 kg)   SpO2 98%   BMI 21.24 kg/m     Wt Readings from Last 3 Encounters:  06/30/21 131 lb 9.6 oz (59.7 kg)  06/16/21 134 lb 3.2 oz (60.9 kg)  04/08/21 135 lb 3.2 oz (61.3 kg)    GEN: Well nourished, well developed in no acute distress HEENT: Normal, moist mucous membranes NECK: No JVD CARDIAC: regular rhythm, normal S1 and S2, no rubs or gallops. 2/6 systolic murmur VASCULAR: Radial and DP pulses 2+ bilaterally. No carotid bruits RESPIRATORY:  Clear to auscultation without rales, wheezing or rhonchi  ABDOMEN:  Soft, non-tender, non-distended MUSCULOSKELETAL:  Ambulates independently SKIN: Warm and dry, no edema NEUROLOGIC:  Alert and oriented x 3. No focal neuro deficits noted. PSYCHIATRIC:  Normal affect    ASSESSMENT:    1. Nonrheumatic mitral valve regurgitation   2. Encounter to discuss test results   3. Cardiac risk counseling   4. Counseling on health promotion and disease prevention     PLAN:    Mitral regurgitation: We discussed echo test results at length today, see above. After shared decision making, she wishes to proceed with TEE for further evaluation Shared Decision Making/Informed Consent{ The risks [esophageal damage, perforation (1:10,000 risk), bleeding, pharyngeal hematoma as well as other potential complications associated with conscious sedation including aspiration, arrhythmia, respiratory failure  and death], benefits (treatment guidance and diagnostic support) and alternatives of a transesophageal echocardiogram were discussed in detail with Alison Stevens and she is willing to proceed.    Cardiac risk counseling and prevention recommendations: -recommend heart healthy/Mediterranean diet, with whole grains, fruits, vegetable, fish, lean meats, nuts, and olive oil. Limit salt. -recommend moderate walking, 3-5 times/week for 30-50 minutes each session. Aim for at least 150 minutes.week. Goal should be pace of 3 miles/hours, or walking 1.5 miles in 30 minutes -recommend avoidance of tobacco products. Avoid excess alcohol.  Plan for follow up: to be determined based on results of TEE  Buford Dresser, MD, PhD [place]  [facility]    Medication Adjustments/Labs and Tests Ordered: Current medicines are reviewed at length with the patient today.  Concerns regarding medicines are outlined above.  No orders of the defined types were placed in this encounter.  No orders of the defined types were placed in this encounter.   Patient Instructions  Medication  Instructions: Your Physician recommend you continue on your current medication as directed.    *If you need a refill on your cardiac medications before your next appointment, please call your pharmacy*   Lab Work: Please have primary care fax lab work to [phone]   Testing/Procedures: Your physician has requested that you have a TEE. During a TEE, sound waves are used to create images of your heart. It provides your doctor with information about the size and shape of your heart and how well your heart's chambers and valves are working. In this test, a transducer is attached to the end of a flexible tube that's guided down your throat and into your esophagus (the tube leading from you mouth to your stomach) to get a more detailed image of your heart. You are not awake for the procedure. Please see the instruction sheet given to you today. For further information please visit [email or web address]    Follow-Up: At [facility], you and your health needs are our priority.  As part of our continuing mission to provide you with exceptional heart care, we have created designated Provider Care Teams.  These Care Teams include your primary Cardiologist (physician) and Advanced Practice Providers (APPs -  Physician Assistants and Nurse Practitioners) who all work together to provide you with the care you need, when you need it.  We recommend signing up for the patient portal called "MyChart".  Sign up information is provided on this After Visit Summary.  MyChart is used to connect with patients for Virtual Visits (Telemedicine).  Patients are able to view lab/test results, encounter notes, upcoming appointments, etc.  Non-urgent messages can be sent to your provider as well.   To learn more about what you can do with MyChart, go to [email or web address].    Your next appointment:   Based on test results  The format for your next appointment:   In Person  Provider:    Buford Dresser, MD    You are scheduled for a TEE on Wednesday 07/21/21 with Dr. Sallyanne Kuster.  Please arrive at the [facility] (Main Entrance A) at [facility]: [address] [place], [place] [address] at 8 am  DIET: Nothing to eat or drink after midnight except a sip of water with medications (see medication instructions below)  FYI: For your safety, and to allow [place] to monitor your vital signs accurately during the surgery/procedure we request that   if you have artificial nails, gel coating, SNS etc.  Please have those removed prior to your surgery/procedure. Not having the nail coverings /polish removed may result in cancellation or delay of your surgery/procedure.   Medication Instructions:    Ok to take medication as prescribed    Labs: Please have primary care fax lab results to [phone]) [phone]  You must have a responsible person to drive you home and stay in the waiting area during your procedure. Failure to do so could result in cancellation.  Bring your insurance cards.  *Special Note: Every effort is made to have your procedure done on time. Occasionally there are emergencies that occur at the hospital that may cause delays. Please be patient if a delay does occur.    Signed, Buford Dresser, MD PhD 06/30/2021 4:37 PM    [place]

## 2021-06-30 NOTE — Telephone Encounter (Signed)
Pt came in after she had her labs done and was told by cardiologist to ask the nurse to fax the copy of the lab results to her cardiologist [name], MD [phone].

## 2021-06-30 NOTE — Progress Notes (Signed)
Cardiology Office Note:    Date:  06/30/2021   ID:  Alison Stevens, DOB 1981-02-15, MRN [id number]  PCP:  Pcp, No  Cardiologist:  Buford Dresser, MD  CC: follow up  History of Present Illness:    Alison Stevens is a 40 y.o. female with a hx of anxiety who is seen for follow up today. I initially met her 10/18/2019 as a new consult at the request of Dr. Letta Median for the evaluation and management of chest pain.  Today: She presents today to discuss and review her recent TTE. This showed eccentric MR.  Her sister is an Research officer, trade union, comes with a list of questions: -does she need a TEE for further evaluation? We discussed today, see below -will this eventually need to be repaired/replaced? Discussed mitral valve at length, when to repair/replace -found to have three tiny strokes on MRI. Could this explain that? Discussed that MRI unfortunately cannot tell us the cause of strokes or when specifically they occurred. -can I exercise normally? Yes. Exercises routinely, intense workouts 3x/week. Can get heart rates up to 170s on the [employer] routinely. Sometimes can't get to the level but typically can do it on the [employer]. Hasn't been able to hit her PR in the last two years. Takes the summers off. -is there anything to look for to know if this is getting dangerous? Discussed heart failure symptoms today. Denies orthopnea, LE edema. Has some shortness of breath at night, improves with taking deep breaths. Has to sit up sometimes with chest wall pain but not due to breathing. Pain ranges from shoulders to abdomen about 5 of 7 nights/week, also with leg twitching. -do I need any medications for this? No.  I personally reviewed her images. Had high fevers as an adult and child, most recently during Covid this year and influenza in 2020. Has had a staph infection in her early 81s. Was told in the past not to take Dayquil because of her heart.   She wants to be proactive and get  as much information as she can. Discussed TEE today, see below, she is amenable.  Past Medical History:  Diagnosis Date   H/O cold sores    SVD (spontaneous vaginal delivery)    x 2    Past Surgical History:  Procedure Laterality Date   DILATION AND EVACUATION N/A 08/31/2017   Procedure: DILATATION AND EVACUATION;  Surgeon: Everlene Farrier, MD;  Location: [facility];  Service: Gynecology;  Laterality: N/A;   WISDOM TOOTH EXTRACTION      Current Medications: Current Outpatient Medications on File Prior to Visit  Medication Sig   aspirin-acetaminophen-caffeine (EXCEDRIN MIGRAINE) 250-250-65 MG tablet Take by mouth every 6 (six) hours as needed for headache.   doxylamine, Sleep, (UNISOM SLEEPTABS) 25 MG tablet Take 25 mg by mouth at bedtime as needed.   Ibuprofen (ADVIL PO) Take by mouth.   albuterol (VENTOLIN HFA) 108 (90 Base) MCG/ACT inhaler Inhale 2 puffs into the lungs every 6 (six) hours as needed for wheezing or shortness of breath. (Patient not taking: Reported on 06/30/2021)   ALPRAZolam (XANAX) 0.25 MG tablet Take 0.25 mg by mouth daily as needed.   Current Facility-Administered Medications on File Prior to Visit  Medication   normal saline NICU flush     Allergies:   Vicodin [hydrocodone-acetaminophen]   Social History   Tobacco Use   Smoking status: Former    Packs/day: 0.25    Years: 4.00    Pack years: 1.00  Types: Cigarettes   Smokeless tobacco: Never   Tobacco comments:    Smoked in [employer] - 4 yrs  Vaping Use   Vaping Use: Never used  Substance Use Topics   Alcohol use: Yes    Comment: occ has 4-5 drinks over a 12 hour period.    Drug use: No    Family History: family history includes Dementia in her paternal grandmother; Migraines in her sister; Seizures in an other family member; Stroke in her paternal grandmother.  ROS:   Please see the history of present illness.  Additional pertinent ROS otherwise unremarkable.  EKGs/Labs/Other  Studies Reviewed:    The following studies were reviewed today: Echo 05/27/21  1. Left ventricular ejection fraction, by estimation, is 60 to 65%. The  left ventricle has normal function. The left ventricle has no regional  wall motion abnormalities. Left ventricular diastolic parameters were  normal.   2. Right ventricular systolic function is normal. The right ventricular  size is normal.   3. MR is eccentric, directed posterior into LA It is moderate at least  Consider TEE to further define valve function, MR severity. . Mild mitral  valve regurgitation.   4. The aortic valve is normal in structure. Aortic valve regurgitation is  not visualized.   5. The inferior vena cava is normal in size with greater than 50%  respiratory variability, suggesting right atrial pressure of 3 mmHg.   6. Agitated saline contrast bubble study was negative, with no evidence  of any interatrial shunt.   EKG:  EKG is personally reviewed.   10/18/19: NSR  Recent Labs: No results found for requested labs within last 8760 hours.  Recent Lipid Panel    Component Value Date/Time   CHOL 217 (H) 11/26/2019 1338   TRIG 102.0 11/26/2019 1338   HDL 56.90 11/26/2019 1338   CHOLHDL 4 11/26/2019 1338   VLDL 20.4 11/26/2019 1338   LDLCALC 139 (H) 11/26/2019 1338   LDLDIRECT 134.0 06/21/2018 1353    Physical Exam:    VS:  BP 102/70   Pulse 94   Ht _0  (1.676 m)   Wt 131 lb 9.6 oz (59.7 kg)   SpO2 98%   BMI 21.24 kg/m     Wt Readings from Last 3 Encounters:  06/30/21 131 lb 9.6 oz (59.7 kg)  06/16/21 134 lb 3.2 oz (60.9 kg)  04/08/21 135 lb 3.2 oz (61.3 kg)    GEN: Well nourished, well developed in no acute distress HEENT: Normal, moist mucous membranes NECK: No JVD CARDIAC: regular rhythm, normal S1 and S2, no rubs or gallops. 2/6 systolic murmur VASCULAR: Radial and DP pulses 2+ bilaterally. No carotid bruits RESPIRATORY:  Clear to auscultation without rales, wheezing or rhonchi  ABDOMEN:  Soft, non-tender, non-distended MUSCULOSKELETAL:  Ambulates independently SKIN: Warm and dry, no edema NEUROLOGIC:  Alert and oriented x 3. No focal neuro deficits noted. PSYCHIATRIC:  Normal affect    ASSESSMENT:    1. Nonrheumatic mitral valve regurgitation   2. Encounter to discuss test results   3. Cardiac risk counseling   4. Counseling on health promotion and disease prevention     PLAN:    Mitral regurgitation: We discussed echo test results at length today, see above. After shared decision making, she wishes to proceed with TEE for further evaluation Shared Decision Making/Informed Consent{ The risks [esophageal damage, perforation (1:10,000 risk), bleeding, pharyngeal hematoma as well as other potential complications associated with conscious sedation including aspiration, arrhythmia, respiratory failure  and death], benefits (treatment guidance and diagnostic support) and alternatives of a transesophageal echocardiogram were discussed in detail with Ms. Soden and she is willing to proceed.    Cardiac risk counseling and prevention recommendations: -recommend heart healthy/Mediterranean diet, with whole grains, fruits, vegetable, fish, lean meats, nuts, and olive oil. Limit salt. -recommend moderate walking, 3-5 times/week for 30-50 minutes each session. Aim for at least 150 minutes.week. Goal should be pace of 3 miles/hours, or walking 1.5 miles in 30 minutes -recommend avoidance of tobacco products. Avoid excess alcohol.  Plan for follow up: to be determined based on results of TEE  Buford Dresser, MD, PhD [place]  [facility]    Medication Adjustments/Labs and Tests Ordered: Current medicines are reviewed at length with the patient today.  Concerns regarding medicines are outlined above.  No orders of the defined types were placed in this encounter.  No orders of the defined types were placed in this encounter.   Patient Instructions  Medication  Instructions: Your Physician recommend you continue on your current medication as directed.    *If you need a refill on your cardiac medications before your next appointment, please call your pharmacy*   Lab Work: Please have primary care fax lab work to [phone]   Testing/Procedures: Your physician has requested that you have a TEE. During a TEE, sound waves are used to create images of your heart. It provides your doctor with information about the size and shape of your heart and how well your heart's chambers and valves are working. In this test, a transducer is attached to the end of a flexible tube that's guided down your throat and into your esophagus (the tube leading from you mouth to your stomach) to get a more detailed image of your heart. You are not awake for the procedure. Please see the instruction sheet given to you today. For further information please visit [email or web address]    Follow-Up: At [facility], you and your health needs are our priority.  As part of our continuing mission to provide you with exceptional heart care, we have created designated Provider Care Teams.  These Care Teams include your primary Cardiologist (physician) and Advanced Practice Providers (APPs -  Physician Assistants and Nurse Practitioners) who all work together to provide you with the care you need, when you need it.  We recommend signing up for the patient portal called "MyChart".  Sign up information is provided on this After Visit Summary.  MyChart is used to connect with patients for Virtual Visits (Telemedicine).  Patients are able to view lab/test results, encounter notes, upcoming appointments, etc.  Non-urgent messages can be sent to your provider as well.   To learn more about what you can do with MyChart, go to [email or web address].    Your next appointment:   Based on test results  The format for your next appointment:   In Person  Provider:    Buford Dresser, MD    You are scheduled for a TEE on Wednesday 07/21/21 with Dr. Sallyanne Kuster.  Please arrive at the [facility] (Main Entrance A) at [facility]: [address] [place], [place] [address] at 8 am  DIET: Nothing to eat or drink after midnight except a sip of water with medications (see medication instructions below)  FYI: For your safety, and to allow [place] to monitor your vital signs accurately during the surgery/procedure we request that   if you have artificial nails, gel coating, SNS etc.  Please have those removed prior to your surgery/procedure. Not having the nail coverings /polish removed may result in cancellation or delay of your surgery/procedure.   Medication Instructions:    Ok to take medication as prescribed    Labs: Please have primary care fax lab results to [phone]) [phone]  You must have a responsible person to drive you home and stay in the waiting area during your procedure. Failure to do so could result in cancellation.  Bring your insurance cards.  *Special Note: Every effort is made to have your procedure done on time. Occasionally there are emergencies that occur at the hospital that may cause delays. Please be patient if a delay does occur.    Signed, Buford Dresser, MD PhD 06/30/2021 4:37 PM    [place]

## 2021-07-01 ENCOUNTER — Other Ambulatory Visit: Payer: Self-pay

## 2021-07-01 DIAGNOSIS — R202 Paresthesia of skin: Secondary | ICD-10-CM

## 2021-07-01 LAB — LIPID PANEL
Cholesterol: 220 mg/dL — ABNORMAL HIGH (ref 0–200)
HDL: 61 mg/dL (ref >39.00)
LDL Cholesterol: 140 mg/dL — ABNORMAL HIGH (ref 0–99)
NonHDL: 159.34
Total CHOL/HDL Ratio: 4
Triglycerides: 99 mg/dL (ref 0.0–149.0)
VLDL: 19.8 mg/dL (ref 0.0–40.0)

## 2021-07-01 LAB — SJOGREN'S SYNDROME ANTIBODS(SSA + SSB)
SSA (Ro) (ENA) Antibody, IgG: <1 AI
SSB (La) (ENA) Antibody, IgG: <1 AI

## 2021-07-01 LAB — ANA: Anti Nuclear Antibody (ANA): NEGATIVE

## 2021-07-01 NOTE — Telephone Encounter (Signed)
Patient advised of her lab results. Labs routed to the Cardiologist.

## 2021-07-05 NOTE — Progress Notes (Signed)
Tried calling no answer. LMOVM to call the pt back.

## 2021-07-06 ENCOUNTER — Encounter (HOSPITAL_COMMUNITY): Payer: Self-pay | Admitting: Cardiovascular Disease

## 2021-07-07 NOTE — Progress Notes (Signed)
Tried to call patient, no answer at 9:40 07/07/2021

## 2021-07-15 ENCOUNTER — Encounter (HOSPITAL_BASED_OUTPATIENT_CLINIC_OR_DEPARTMENT_OTHER): Payer: Self-pay | Admitting: Cardiology

## 2021-07-16 ENCOUNTER — Other Ambulatory Visit: Payer: Self-pay

## 2021-07-16 DIAGNOSIS — I639 Cerebral infarction, unspecified: Secondary | ICD-10-CM

## 2021-07-21 ENCOUNTER — Other Ambulatory Visit: Payer: Self-pay

## 2021-07-21 ENCOUNTER — Encounter (HOSPITAL_COMMUNITY): Payer: Self-pay | Admitting: Cardiovascular Disease

## 2021-07-21 ENCOUNTER — Encounter (HOSPITAL_COMMUNITY)
Admission: RE | Disposition: A | Discharge status: Home / Self Care | Payer: Self-pay | Source: Home / Self Care | Attending: Cardiovascular Disease

## 2021-07-21 ENCOUNTER — Ambulatory Visit (HOSPITAL_COMMUNITY)
Admission: RE | Admit: 2021-07-21 | Discharge: 2021-07-21 | Disposition: A | Discharge status: Home / Self Care | Payer: BC Managed Care – PPO | Attending: Cardiovascular Disease | Admitting: Cardiovascular Disease

## 2021-07-21 ENCOUNTER — Ambulatory Visit (HOSPITAL_COMMUNITY): Payer: BC Managed Care – PPO | Admitting: Certified Registered"

## 2021-07-21 ENCOUNTER — Ambulatory Visit (HOSPITAL_BASED_OUTPATIENT_CLINIC_OR_DEPARTMENT_OTHER): Payer: BC Managed Care – PPO

## 2021-07-21 DIAGNOSIS — I080 Rheumatic disorders of both mitral and aortic valves: Secondary | ICD-10-CM | POA: Diagnosis not present

## 2021-07-21 DIAGNOSIS — F419 Anxiety disorder, unspecified: Secondary | ICD-10-CM | POA: Insufficient documentation

## 2021-07-21 DIAGNOSIS — H5702 Anisocoria: Secondary | ICD-10-CM | POA: Diagnosis not present

## 2021-07-21 DIAGNOSIS — I340 Nonrheumatic mitral (valve) insufficiency: Secondary | ICD-10-CM

## 2021-07-21 DIAGNOSIS — H209 Unspecified iridocyclitis: Secondary | ICD-10-CM | POA: Diagnosis not present

## 2021-07-21 DIAGNOSIS — M940 Chondrocostal junction syndrome [Tietze]: Secondary | ICD-10-CM | POA: Diagnosis not present

## 2021-07-21 HISTORY — DX: Nonrheumatic mitral (valve) insufficiency: I34.0

## 2021-07-21 HISTORY — PX: TEE WITHOUT CARDIOVERSION: SHX5443

## 2021-07-21 LAB — ECHO TEE
MV M vel: 5.16 m/s
MV Peak grad: 106.5 mmHg
Radius: 0.6 cm

## 2021-07-21 SURGERY — ECHOCARDIOGRAM, TRANSESOPHAGEAL
Anesthesia: General

## 2021-07-21 MED ORDER — CALCIUM CARBONATE ANTACID 500 MG PO CHEW: 1.0000 | CHEWABLE_TABLET | Freq: Every day | ORAL | Status: DC | PRN

## 2021-07-21 MED ORDER — SODIUM CHLORIDE 0.9 % IV SOLN: INTRAVENOUS | Status: DC

## 2021-07-21 MED ORDER — BUTAMBEN-TETRACAINE-BENZOCAINE 2-2-14 % EX AERO
INHALATION_SPRAY | CUTANEOUS | Status: DC | PRN
  Administered 2021-07-21: 2 via TOPICAL

## 2021-07-21 MED ORDER — IBUPROFEN 200 MG PO TABS
400.0000 mg | ORAL_TABLET | Freq: Three times a day (TID) | ORAL | 0 refills | Status: AC | PRN
Start: 2021-07-21 — End: ?

## 2021-07-21 MED ORDER — PERFLUTREN LIPID MICROSPHERE
1.0000 mL | INTRAVENOUS | Status: DC | PRN
  Filled 2021-07-21: qty 10

## 2021-07-21 MED ORDER — PERFLUTREN LIPID MICROSPHERE
1.0000 mL | INTRAVENOUS | Status: DC | PRN
Start: 2021-07-21 — End: 2021-07-21
  Filled 2021-07-21: qty 10

## 2021-07-21 MED ORDER — PROPOFOL 10 MG/ML IV BOLUS
INTRAVENOUS | Status: DC | PRN
  Administered 2021-07-21 (×3): 20 mg via INTRAVENOUS

## 2021-07-21 MED ORDER — PROPOFOL 500 MG/50ML IV EMUL
INTRAVENOUS | Status: DC | PRN
  Administered 2021-07-21: 100 ug/kg/min via INTRAVENOUS

## 2021-07-21 NOTE — Anesthesia Procedure Notes (Signed)
Procedure Name: MAC Date/Time: 07/21/2021 9:03 AM Performed by: Moshe Salisbury, CRNA Pre-anesthesia Checklist: Patient identified, Emergency Drugs available, Suction available and Patient being monitored Oxygen Delivery Method: Nasal cannula Placement Confirmation: positive ETCO2 Dental Injury: Teeth and Oropharynx as per pre-operative assessment

## 2021-07-21 NOTE — Anesthesia Preprocedure Evaluation (Signed)
Anesthesia Evaluation  Patient identified by MRN, date of birth, ID band Patient awake    Reviewed: Allergy & Precautions, NPO status , Patient's Chart, lab work & pertinent test results  Airway Mallampati: I  TM Distance: >3 FB     Dental   Pulmonary former smoker,    breath sounds clear to auscultation       Cardiovascular  Rhythm:Regular Rate:Normal + Systolic murmurs    Neuro/Psych    GI/Hepatic negative GI ROS, Neg liver ROS,   Endo/Other  negative endocrine ROS  Renal/GU negative Renal ROS     Musculoskeletal   Abdominal   Peds  Hematology   Anesthesia Other Findings   Reproductive/Obstetrics                             Anesthesia Physical Anesthesia Plan  ASA: 3  Anesthesia Plan: General   Post-op Pain Management:    Induction:   PONV Risk Score and Plan: 3 and Ondansetron, Propofol infusion and Treatment may vary due to age or medical condition  Airway Management Planned: Simple Face Mask and Nasal Cannula  Additional Equipment:   Intra-op Plan:   Post-operative Plan:   Informed Consent: I have reviewed the patients History and Physical, chart, labs and discussed the procedure including the risks, benefits and alternatives for the proposed anesthesia with the patient or authorized representative who has indicated his/her understanding and acceptance.     Dental advisory given  Plan Discussed with: CRNA and Anesthesiologist  Anesthesia Plan Comments:         Anesthesia Quick Evaluation

## 2021-07-21 NOTE — Transfer of Care (Signed)
Immediate Anesthesia Transfer of Care Note  Patient: Alison Stevens  Procedure(s) Performed: TRANSESOPHAGEAL ECHOCARDIOGRAM (TEE)  Patient Location: Endoscopy Unit  Anesthesia Type:MAC  Level of Consciousness: awake and patient cooperative  Airway & Oxygen Therapy: Patient Spontanous Breathing and Patient connected to nasal cannula oxygen  Post-op Assessment: Report given to RN, Post -op Vital signs reviewed and stable and Patient moving all extremities  Post vital signs: Reviewed and stable  Last Vitals:  Vitals Value Taken Time  BP 99/62 07/21/21 0923  Temp    Pulse 80 07/21/21 0924  Resp 17 07/21/21 0924  SpO2 100 % 07/21/21 0924  Vitals shown include unvalidated device data.  Last Pain:  Vitals:   07/21/21 0815  TempSrc: Temporal  PainSc: 0-No pain         Complications: No notable events documented.

## 2021-07-21 NOTE — Progress Notes (Signed)
  Echocardiogram TEE has been performed.  Roosvelt Maser F 07/21/2021, 9:26 AM

## 2021-07-21 NOTE — Op Note (Signed)
INDICATIONS: Mitral insufficiency  PROCEDURE:   Informed consent was obtained prior to the procedure. The risks, benefits and alternatives for the procedure were discussed and the patient comprehended these risks.  Risks include, but are not limited to, cough, sore throat, vomiting, nausea, somnolence, esophageal and stomach trauma or perforation, bleeding, low blood pressure, aspiration, pneumonia, infection, trauma to the teeth and death.    After a procedural time-out, the oropharynx was anesthetized with 20% benzocaine spray.   During this procedure the patient was administered IV propofol by Anesthesiology, Dr. Chilton Si.  The transesophageal probe was inserted in the esophagus and stomach without difficulty and multiple views were obtained.  The patient was kept under observation until the patient left the procedure room.  The patient left the procedure room in stable condition.   Agitated microbubble saline contrast was not administered.  COMPLICATIONS:    There were no immediate complications.  FINDINGS:  Myxomatous mitral valve changes. Mild-moderate central MR. Otherwise normal echo.  RECOMMENDATIONS:    Unless patient becomes symptomatic, transthoracic echo follow up in 1-2 years is appropriate, every 3-5 years thereafter if MR severity is unchanged.  Time Spent Directly with the Patient:  30 minutes   Alison Stevens 07/21/2021, 9:20 AM

## 2021-07-21 NOTE — Discharge Instructions (Signed)

## 2021-07-21 NOTE — Anesthesia Postprocedure Evaluation (Signed)
Anesthesia Post Note  Patient: Alison Stevens  Procedure(s) Performed: TRANSESOPHAGEAL ECHOCARDIOGRAM (TEE)     Patient location during evaluation: Endoscopy Anesthesia Type: General Level of consciousness: awake Pain management: pain level controlled Vital Signs Assessment: post-procedure vital signs reviewed and stable Respiratory status: spontaneous breathing Cardiovascular status: stable Postop Assessment: no apparent nausea or vomiting Anesthetic complications: no   No notable events documented.  Last Vitals:  Vitals:   07/21/21 0942 07/21/21 0948  BP: 128/75 107/72  Pulse: 77 81  Resp: 20 17  Temp:    SpO2: 99% 100%    Last Pain:  Vitals:   07/21/21 0815  TempSrc: Temporal  PainSc: 0-No pain                 Shavon Zenz

## 2021-07-21 NOTE — Interval H&P Note (Signed)
History and Physical Interval Note:  07/21/2021 8:47 AM  Alison Stevens  has presented today for surgery, with the diagnosis of MITRO REGURGITATION.  The various methods of treatment have been discussed with the patient and family. After consideration of risks, benefits and other options for treatment, the patient has consented to  Procedure(s): TRANSESOPHAGEAL ECHOCARDIOGRAM (TEE) (N/A) as a surgical intervention.  The patient's history has been reviewed, patient examined, no change in status, stable for surgery.  I have reviewed the patient's chart and labs.  Questions were answered to the patient's satisfaction.     Bao Bazen

## 2021-07-23 ENCOUNTER — Encounter (HOSPITAL_COMMUNITY): Payer: Self-pay | Admitting: Cardiovascular Disease

## 2021-07-23 ENCOUNTER — Ambulatory Visit (HOSPITAL_BASED_OUTPATIENT_CLINIC_OR_DEPARTMENT_OTHER): Payer: BC Managed Care – PPO | Admitting: Cardiology

## 2021-07-26 NOTE — Progress Notes (Signed)
[facility] CONSULT NOTE  Patient Care Team: Pcp, No as PCP - General [name], MD as PCP - Cardiology (Cardiology)  CHIEF COMPLAINTS/PURPOSE OF CONSULTATION:  Newly diagnosed left breast LCIS, ALH  HISTORY OF PRESENTING ILLNESS:  Alison Stevens 40 y.o. female is here because of recent diagnosis of LCIS and atypical lobular hyperplasia of the left breast. Diagnostic mammogram and [place] on 05/18/2021 showed persistent distortion in the lateral aspect of the left breast, without sonographic correlate. Left lumpectomy on 05/21/2021 showed complex sclerosing lesion with lobular neoplasia (LCIS and atypical lobular hyperplasia.) She presents to the clinic today for initial evaluation and discussion of treatment options.   I reviewed her records extensively and collaborated the history with the patient.    MEDICAL HISTORY:  Past Medical History:  Diagnosis Date   H/O cold sores    SVD (spontaneous vaginal delivery)    x 2    SURGICAL HISTORY: Past Surgical History:  Procedure Laterality Date   DILATION AND EVACUATION N/A 08/31/2017   Procedure: DILATATION AND EVACUATION;  Surgeon: Harold Hedge, MD;  [facility];  Service: Gynecology;  Laterality: N/A;   TEE WITHOUT CARDIOVERSION N/A 07/21/2021   Procedure: TRANSESOPHAGEAL ECHOCARDIOGRAM (TEE);  Surgeon: Thurmon Fair, MD;  Location: [facility] ENDOSCOPY;  Service: Cardiovascular;  Laterality: N/A;   WISDOM TOOTH EXTRACTION      SOCIAL HISTORY: Social History   Socioeconomic History   Marital status: Married    Spouse name: Not on file   Number of children: Not on file   Years of education: Not on file   Highest education level: Not on file  Occupational History   Not on file  Tobacco Use   Smoking status: Former    Packs/day: 0.25    Years: 4.00    Pack years: 1.00    Types: Cigarettes   Smokeless tobacco: Never   Tobacco comments:    Smoked in [employer] - 4 yrs  Vaping Use    Vaping Use: Never used  Substance and Sexual Activity   Alcohol use: Yes    Comment: occ has 4-5 drinks over a 12 hour period.    Drug use: No   Sexual activity: Yes    Birth control/protection: None    Comment: approx [redacted] wks gestation per patient 08/25/17  Other Topics Concern   Not on file  Social History Narrative   Right handed   Social Determinants of Health   Financial Resource Strain: Not on file  Food Insecurity: Not on file  Transportation Needs: Not on file  Physical Activity: Not on file  Stress: Not on file  Social Connections: Not on file  Intimate Partner Violence: Not on file    FAMILY HISTORY: Family History  Problem Relation Age of Onset   Migraines Sister    Dementia Paternal Grandmother    Stroke Paternal Grandmother    Seizures Other     ALLERGIES:  is allergic to vicodin [hydrocodone-acetaminophen].  MEDICATIONS:  Current Outpatient Medications  Medication Sig Dispense Refill   ALPRAZolam (XANAX) 0.25 MG tablet Take 0.25 mg by mouth at bedtime as needed for anxiety or sleep.     aspirin-acetaminophen-caffeine (EXCEDRIN MIGRAINE) 250-250-65 MG tablet Take 2 tablets by mouth every 6 (six) hours as needed for headache.     bismuth subsalicylate (PEPTO BISMOL) 262 MG/15ML suspension Take 30 mLs by mouth every 6 (six) hours as needed for diarrhea or loose stools or indigestion.     calcium carbonate (TUMS -  DOSED IN MG ELEMENTAL CALCIUM) 500 MG chewable tablet Chew 1-2 tablets (200-400 mg of elemental calcium total) by mouth daily as needed for indigestion or heartburn.     doxylamine, Sleep, (UNISOM SLEEPTABS) 25 MG tablet Take 12.5-25 mg by mouth at bedtime.     ibuprofen (ADVIL) 200 MG tablet Take 2-4 tablets (400-800 mg total) by mouth every 8 (eight) hours as needed for moderate pain or headache. 30 tablet 0   tretinoin (RETIN-A) 0.05 % cream Apply 1 application topically every other day. At night     No current facility-administered medications for  this visit.   Facility-Administered Medications Ordered in Other Visits  Medication Dose Route Frequency Provider Last Rate Last Admin   normal saline NICU flush  0.5-1.7 mL Intravenous PRN Shon Millet R, DO        REVIEW OF SYSTEMS:   Constitutional: Denies fevers, chills or abnormal night sweats Biopsy caused bruising All other systems were reviewed with the patient and are negative.  PHYSICAL EXAMINATION: ECOG PERFORMANCE STATUS: 0 - Asymptomatic  Vitals:   07/27/21 1250  BP: 108/65  Pulse: 80  Resp: 18  Temp: 97.7 F (36.5 C)  SpO2: 100%   Filed Weights   07/27/21 1250  Weight: 132 lb 6.4 oz (60.1 kg)      LABORATORY DATA:  I have reviewed the data as listed Lab Results  Component Value Date   WBC 5.1 06/30/2021   HGB 13.1 06/30/2021   HCT 39.3 06/30/2021   MCV 90.7 06/30/2021   PLT 223.0 06/30/2021   Lab Results  Component Value Date   NA 139 06/30/2021   K 4.5 06/30/2021   CL 105 06/30/2021   CO2 24 06/30/2021    RADIOGRAPHIC STUDIES: I have personally reviewed the radiological reports and agreed with the findings in the report.  ASSESSMENT AND PLAN:  Lobular carcinoma in situ (LCIS) of left breast 05/31/2021: Left breast biopsy: Complex sclerosing lesion with LCIS and [facility]  Pathology counseling: I discussed with her the difference between LCIS and invasive cancer. LCIS is a risk factor for breast cancer and not a premalignant condition. Hence it does not need to be resected. However she is at higher than normal risk of breast cancer. Risk of invasive and noninvasive breast cancer with LCIS is 1% per year. Her cumulative risk lifetime would be around 40%. Tamoxifen would reduce this risk by half. This is based on NSABP P-1 clinical trial  Risk reduction strategies: 1. Tamoxifen or raloxifene are contraindicated because of prior history of 3 strokes.  If her neurologist would clear her then we can consider adding it at a later time. 2. Recommended annual  mammograms and breast exams and annual breast MRIs.   We also discussed that after a few negative breast MRIs we can consider doing breast MRI every other year.  We will consult with genetics regarding her concerns for breast cancer risk Telephone visit after surgery to discuss pathology report.  All questions were answered. The patient knows to call the clinic with any problems, questions or concerns.   Sabas Sous, MD, MPH 07/27/2021    I, Alda Ponder, am acting as scribe for Serena Croissant, MD.  I have reviewed the above documentation for accuracy and completeness, and I agree with the above.

## 2021-07-27 ENCOUNTER — Inpatient Hospital Stay: Payer: BC Managed Care – PPO | Attending: Hematology and Oncology | Admitting: Hematology and Oncology

## 2021-07-27 ENCOUNTER — Inpatient Hospital Stay (HOSPITAL_BASED_OUTPATIENT_CLINIC_OR_DEPARTMENT_OTHER): Payer: BC Managed Care – PPO | Admitting: Genetic Counselor

## 2021-07-27 ENCOUNTER — Inpatient Hospital Stay: Payer: BC Managed Care – PPO

## 2021-07-27 ENCOUNTER — Encounter: Payer: Self-pay | Admitting: Genetic Counselor

## 2021-07-27 ENCOUNTER — Other Ambulatory Visit: Payer: Self-pay

## 2021-07-27 ENCOUNTER — Other Ambulatory Visit: Payer: Self-pay | Admitting: Genetic Counselor

## 2021-07-27 DIAGNOSIS — Z8042 Family history of malignant neoplasm of prostate: Secondary | ICD-10-CM

## 2021-07-27 DIAGNOSIS — N6489 Other specified disorders of breast: Secondary | ICD-10-CM | POA: Insufficient documentation

## 2021-07-27 DIAGNOSIS — Z1379 Encounter for other screening for genetic and chromosomal anomalies: Secondary | ICD-10-CM

## 2021-07-27 DIAGNOSIS — N6092 Unspecified benign mammary dysplasia of left breast: Secondary | ICD-10-CM | POA: Insufficient documentation

## 2021-07-27 DIAGNOSIS — D0502 Lobular carcinoma in situ of left breast: Secondary | ICD-10-CM

## 2021-07-27 DIAGNOSIS — Z800 Family history of malignant neoplasm of digestive organs: Secondary | ICD-10-CM | POA: Diagnosis not present

## 2021-07-27 LAB — GENETIC SCREENING ORDER

## 2021-07-27 NOTE — Progress Notes (Signed)
REFERRING PROVIDER: Nicholas Lose, MD [place], [place] [address]  PRIMARY PROVIDER:  Pcp, No  PRIMARY REASON FOR VISIT:  Encounter Diagnoses  Name Primary?   Lobular carcinoma in situ (LCIS) of left breast Yes   Family history of prostate cancer     HISTORY OF PRESENT ILLNESS:   Ms. Divelbiss, a 40 y.o. female, was seen for a [facility] cancer genetics consultation at the request of Dr. Lindi Adie due to a personal history of LCIS and family history of cancer. Ms. Mehl presents to clinic today to discuss the possibility of a hereditary predisposition to cancer, to discuss genetic testing, and to further clarify her future cancer risks, as well as potential cancer risks for family members.   CANCER HISTORY:  In October 2022, at the age of 56, Ms. Bassin was diagnosed with lobular carcinoma in situ (LCIS) of the left breast.   RISK FACTORS:  First live birth at age 29.  OCP use for approximately 4 years.  Ovaries intact: yes.  Uterus intact: yes.  Menopausal status: premenopausal.  HRT use: 0 years. Colonoscopy: no Mammogram within the last year: yes. Any excessive radiation exposure in the past: no  Past Medical History:  Diagnosis Date   H/O cold sores    SVD (spontaneous vaginal delivery)    x 2    Past Surgical History:  Procedure Laterality Date   DILATION AND EVACUATION N/A 08/31/2017   Procedure: DILATATION AND EVACUATION;  Surgeon: Everlene Farrier, MD;  Location: [facility];  Service: Gynecology;  Laterality: N/A;   TEE WITHOUT CARDIOVERSION N/A 07/21/2021   Procedure: TRANSESOPHAGEAL ECHOCARDIOGRAM (TEE);  Surgeon: Sanda Klein, MD;  Location: [facility] END[facility]COPY;  Service: Cardiovascular;  Laterality: N/A;   WISDOM TOOTH EXTRACTION      Social History   Socioeconomic History   Marital status: Married    Spouse name: Not on file   Number of children: Not on file   Years of education: Not on file   Highest education level: Not on file   Occupational History   Not on file  Tobacco Use   Smoking status: Former    Packs/day: 0.25    Years: 4.00    Pack years: 1.00    Types: Cigarettes   Smokeless tobacco: Never   Tobacco comments:    Smoked in [employer] - 4 yrs  Vaping Use   Vaping Use: Never used  Substance and Sexual Activity   Alcohol use: Yes    Comment: occ has 4-5 drinks over a 12 hour period.    Drug use: No   Sexual activity: Yes    Birth control/protection: None    Comment: approx [redacted] wks gestation per patient 08/25/17  Other Topics Concern   Not on file  Social History Narrative   Right handed   Social Determinants of Health   Financial Resource Strain: Not on file  Food Insecurity: Not on file  Transportation Needs: Not on file  Physical Activity: Not on file  Stress: Not on file  Social Connections: Not on file     FAMILY HISTORY:  We obtained a detailed, 4-generation family history.  Significant diagnoses are listed below: Family History  Problem Relation Age of Onset   Migraines Sister    Lymphoma Maternal Uncle    Colon cancer Maternal Uncle    Cancer Maternal Grandmother        possibly breast cancer   Dementia Paternal Grandmother    Stroke Paternal Grandmother  Prostate cancer Paternal Grandfather    Seizures Other      Ms. Peltzer's maternal uncle was diagnosed with colon cancer and a second maternal uncle was diagnosed with lymphoma. She reports her maternal grandmother possibly had breast cancer. Her paternal grandfather died due to metastatic prostate cancer. Ms. [place] is unaware of previous family history of genetic testing for hereditary cancer risks. There is <1% reported Ashkenazi Jewish ancestry.   GENETIC COUNSELING ASSESSMENT: Ms. Taha is a 40 y.o. female with a personal history of LCIS and family history of cancer which is somewhat suggestive of a hereditary cancer syndrome and predisposition to cancer given her young age at diagnosis. We, therefore, discussed  and recommended the following at today's visit.   DISCUSSION: We discussed that 5 - 10% of cancer is hereditary, with most cases of hereditary breast cancer associated with mutations in BRCA1/2.  There are other genes that can be associated with hereditary breast cancer syndromes. Type of cancer risk and level of risk are gene-specific. We discussed that testing is beneficial for several reasons including knowing how to follow individuals after completing their treatment, identifying whether potential treatment options would be beneficial, and understanding if other family members could be at risk for cancer and allowing them to undergo genetic testing.   We reviewed the characteristics, features and inheritance patterns of hereditary cancer syndromes. We also discussed genetic testing, including the appropriate family members to test, the process of testing, insurance coverage and turn-around-time for results. We discussed the implications of a negative, positive and/or variant of uncertain significant result. In order to get genetic test results in a timely manner so that Ms. Muzzy can use these genetic test results for surgical decisions, we recommended Ms. Leffler pursue genetic testing for [employer] Once complete, we recommend Ms. Gowell pursue reflex genetic testing to a more comprehensive gene panel.   Ms. Obando  was offered a common hereditary cancer panel (47 genes) and an expanded pan-cancer panel (77 genes). Ms. Rincon was informed of the benefits and limitations of each panel, including that expanded pan-cancer panels contain genes that do not have clear management guidelines at this point in time.  We also discussed that as the number of genes included on a panel increases, the chances of variants of uncertain significance increases.  After considering the benefits and limitations of each gene panel, Ms. Klunder elected to have [facility] CancerNext-Expanded Panel+RNA.  The  CancerNext-Expanded gene panel offered by [facility] and includes sequencing, rearrangement, and RNA analysis for the following 77 genes: AIP, ALK, APC, ATM, AXIN2, BAP1, BARD1, BLM, BMPR1A, BRCA1, BRCA2, BRIP1, CDC73, CDH1, CDK4, CDKN1B, CDKN2A, CHEK2, CTNNA1, DICER1, FANCC, FH, FLCN, GALNT12, KIF1B, LZTR1, MAX, MEN1, MET, MLH1, MSH2, MSH3, MSH6, MUTYH, NBN, NF1, NF2, NTHL1, PALB2, PHOX2B, PMS2, POT1, PRKAR1A, PTCH1, PTEN, RAD51C, RAD51D, RB1, RECQL, RET, SDHA, SDHAF2, SDHB, SDHC, SDHD, SMAD4, SMARCA4, SMARCB1, SMARCE1, STK11, SUFU, TMEM127, TP53, TSC1, TSC2, VHL and XRCC2 (sequencing and deletion/duplication); EGFR, EGLN1, HOXB13, KIT, MITF, PDGFRA, POLD1, and POLE (sequencing only); EPCAM and GREM1 (deletion/duplication only).   Based on Ms. Kihn's personal and family history, she meets medical criteria for genetic testing. Despite that she meets criteria, she may still have an out of pocket cost. We discussed that if her out of pocket cost for testing is over $100, the laboratory should contact them to discuss self-pay prices, patient pay assistance programs, if applicable, and other billing options.   PLAN: After considering the risks, benefits, and limitations, Ms. [employer]  provided informed consent to pursue genetic testing and the blood sample was sent to [facility] for analysis of the CancerNext-Expanded+RNA Panel. Results should be available within approximately 1-2 weeks' time, at which point they will be disclosed by telephone to Ms. Fabel, as will any additional recommendations warranted by these results. Ms. Butz will receive a summary of her genetic counseling visit and a copy of her results once available. This information will also be available in Epic.   Ms. Curl questions were answered to her satisfaction today. Our contact information was provided should additional questions or concerns arise. Thank you for the referral and allowing [place] to share in the care of your  patient.   Lucille Passy, MS, [facility] Genetic Counselor [email or web address] (P) [phone]  The patient was seen for a total of 35 minutes in face-to-face genetic counseling. The patient was seen alone.  Drs. Magrinat, Lindi Adie and/or Burr Medico were available to discuss this case as needed.  _______________________________________________________________________ For Office Staff:  Number of people involved in session: 1 Was an Intern/ student involved with case: no

## 2021-07-27 NOTE — Assessment & Plan Note (Signed)
05/31/2021: Left breast biopsy: Complex sclerosing lesion with LCIS and [facility]  Pathology counseling: I discussed with her the difference between LCIS and invasive cancer. LCIS is a risk factor for breast cancer and not a premalignant condition. Hence it does not need to be resected. However she is at higher than normal risk of breast cancer. Risk of invasive and noninvasive breast cancer with LCIS is 1% per year. Her cumulative risk lifetime would be around 40%. Tamoxifen would reduce this risk by half. This is based on NSABP P-1 clinical trial  Risk reduction strategies: 1. Tamoxifen or raloxifene are contraindicated because of prior history of 3 strokes.  If her neurologist would clear her then we can consider adding it at a later time. 2. Recommended annual mammograms and breast exams and annual breast MRIs.   We also discussed that after a few negative breast MRIs we can consider doing breast MRI every other year.  We will consult with genetics regarding her concerns for breast cancer risk

## 2021-07-28 DIAGNOSIS — Z8042 Family history of malignant neoplasm of prostate: Secondary | ICD-10-CM | POA: Insufficient documentation

## 2021-07-30 ENCOUNTER — Other Ambulatory Visit: Payer: Self-pay

## 2021-07-30 ENCOUNTER — Encounter (HOSPITAL_BASED_OUTPATIENT_CLINIC_OR_DEPARTMENT_OTHER): Payer: Self-pay | Admitting: General Surgery

## 2021-08-02 NOTE — Progress Notes (Signed)
Text message reminder sent to come to [facility] today for lab work, drink and surgical soap pick up.

## 2021-08-04 ENCOUNTER — Encounter (HOSPITAL_BASED_OUTPATIENT_CLINIC_OR_DEPARTMENT_OTHER)
Admission: RE | Admit: 2021-08-04 | Discharge: 2021-08-04 | Disposition: A | Discharge status: Home / Self Care | Payer: BC Managed Care – PPO | Source: Ambulatory Visit | Attending: General Surgery | Admitting: General Surgery

## 2021-08-04 ENCOUNTER — Ambulatory Visit
Admission: RE | Admit: 2021-08-04 | Discharge: 2021-08-04 | Disposition: A | Discharge status: Home / Self Care | Payer: BC Managed Care – PPO | Source: Ambulatory Visit | Attending: General Surgery | Admitting: General Surgery

## 2021-08-04 DIAGNOSIS — D0502 Lobular carcinoma in situ of left breast: Secondary | ICD-10-CM

## 2021-08-04 DIAGNOSIS — R928 Other abnormal and inconclusive findings on diagnostic imaging of breast: Secondary | ICD-10-CM | POA: Diagnosis not present

## 2021-08-04 DIAGNOSIS — N6489 Other specified disorders of breast: Secondary | ICD-10-CM | POA: Diagnosis not present

## 2021-08-04 DIAGNOSIS — N6092 Unspecified benign mammary dysplasia of left breast: Secondary | ICD-10-CM | POA: Diagnosis not present

## 2021-08-04 NOTE — Progress Notes (Signed)

## 2021-08-05 ENCOUNTER — Ambulatory Visit (HOSPITAL_BASED_OUTPATIENT_CLINIC_OR_DEPARTMENT_OTHER): Payer: BC Managed Care – PPO | Admitting: Anesthesiology

## 2021-08-05 ENCOUNTER — Encounter (HOSPITAL_BASED_OUTPATIENT_CLINIC_OR_DEPARTMENT_OTHER)
Admission: RE | Disposition: A | Discharge status: Home / Self Care | Payer: Self-pay | Source: Home / Self Care | Attending: General Surgery

## 2021-08-05 ENCOUNTER — Ambulatory Visit
Admission: RE | Admit: 2021-08-05 | Discharge: 2021-08-05 | Disposition: A | Discharge status: Home / Self Care | Payer: BC Managed Care – PPO | Source: Ambulatory Visit | Attending: General Surgery | Admitting: General Surgery

## 2021-08-05 ENCOUNTER — Other Ambulatory Visit: Payer: Self-pay

## 2021-08-05 ENCOUNTER — Encounter (HOSPITAL_BASED_OUTPATIENT_CLINIC_OR_DEPARTMENT_OTHER): Payer: Self-pay | Admitting: General Surgery

## 2021-08-05 ENCOUNTER — Ambulatory Visit (HOSPITAL_BASED_OUTPATIENT_CLINIC_OR_DEPARTMENT_OTHER)
Admission: RE | Admit: 2021-08-05 | Discharge: 2021-08-05 | Disposition: A | Discharge status: Home / Self Care | Payer: BC Managed Care – PPO | Attending: General Surgery | Admitting: General Surgery

## 2021-08-05 DIAGNOSIS — Z87891 Personal history of nicotine dependence: Secondary | ICD-10-CM | POA: Diagnosis not present

## 2021-08-05 DIAGNOSIS — D0512 Intraductal carcinoma in situ of left breast: Secondary | ICD-10-CM | POA: Diagnosis not present

## 2021-08-05 DIAGNOSIS — D0502 Lobular carcinoma in situ of left breast: Secondary | ICD-10-CM | POA: Diagnosis not present

## 2021-08-05 DIAGNOSIS — F419 Anxiety disorder, unspecified: Secondary | ICD-10-CM | POA: Insufficient documentation

## 2021-08-05 DIAGNOSIS — Z8673 Personal history of transient ischemic attack (TIA), and cerebral infarction without residual deficits: Secondary | ICD-10-CM | POA: Insufficient documentation

## 2021-08-05 DIAGNOSIS — R928 Other abnormal and inconclusive findings on diagnostic imaging of breast: Secondary | ICD-10-CM | POA: Diagnosis not present

## 2021-08-05 DIAGNOSIS — N6489 Other specified disorders of breast: Secondary | ICD-10-CM | POA: Diagnosis not present

## 2021-08-05 HISTORY — DX: Personal history of in-situ neoplasm of breast: Z86.000

## 2021-08-05 HISTORY — DX: Unspecified asthma, uncomplicated: J45.909

## 2021-08-05 HISTORY — PX: BREAST LUMPECTOMY WITH RADIOACTIVE SEED LOCALIZATION: SHX6424

## 2021-08-05 LAB — POCT PREGNANCY, URINE: Preg Test, Ur: NEGATIVE

## 2021-08-05 SURGERY — BREAST LUMPECTOMY WITH RADIOACTIVE SEED LOCALIZATION
Anesthesia: General | Site: Breast | Laterality: Left

## 2021-08-05 MED ORDER — PROPOFOL 10 MG/ML IV BOLUS
INTRAVENOUS | Status: AC
  Filled 2021-08-05: qty 20

## 2021-08-05 MED ORDER — CEFAZOLIN SODIUM-DEXTROSE 2-4 GM/100ML-% IV SOLN
2.0000 g | INTRAVENOUS | Status: AC
  Administered 2021-08-05: 12:00:00 2 g via INTRAVENOUS

## 2021-08-05 MED ORDER — OXYCODONE HCL 5 MG PO TABS
5.0000 mg | ORAL_TABLET | Freq: Once | ORAL | Status: AC
  Administered 2021-08-05: 14:00:00 5 mg via ORAL

## 2021-08-05 MED ORDER — OXYCODONE HCL 5 MG PO TABS
ORAL_TABLET | ORAL | Status: AC
  Filled 2021-08-05: qty 1

## 2021-08-05 MED ORDER — SCOPOLAMINE 1 MG/3DAYS TD PT72
MEDICATED_PATCH | TRANSDERMAL | Status: AC
  Filled 2021-08-05: qty 1

## 2021-08-05 MED ORDER — PROMETHAZINE HCL 25 MG/ML IJ SOLN: 6.2500 mg | INTRAMUSCULAR | Status: DC | PRN

## 2021-08-05 MED ORDER — PROPOFOL 10 MG/ML IV BOLUS
INTRAVENOUS | Status: DC | PRN
  Administered 2021-08-05: 150 mg via INTRAVENOUS

## 2021-08-05 MED ORDER — CELECOXIB 200 MG PO CAPS
200.0000 mg | ORAL_CAPSULE | ORAL | Status: AC
  Administered 2021-08-05: 11:00:00 200 mg via ORAL

## 2021-08-05 MED ORDER — GABAPENTIN 300 MG PO CAPS
ORAL_CAPSULE | ORAL | Status: AC
  Filled 2021-08-05: qty 1

## 2021-08-05 MED ORDER — MIDAZOLAM HCL 2 MG/2ML IJ SOLN
INTRAMUSCULAR | Status: AC
  Filled 2021-08-05: qty 2

## 2021-08-05 MED ORDER — FENTANYL CITRATE (PF) 100 MCG/2ML IJ SOLN
INTRAMUSCULAR | Status: AC
  Filled 2021-08-05: qty 2

## 2021-08-05 MED ORDER — ACETAMINOPHEN 500 MG PO TABS
ORAL_TABLET | ORAL | Status: AC
  Filled 2021-08-05: qty 2

## 2021-08-05 MED ORDER — SCOPOLAMINE 1 MG/3DAYS TD PT72
1.0000 | MEDICATED_PATCH | TRANSDERMAL | Status: DC
  Administered 2021-08-05: 11:00:00 1.5 mg via TRANSDERMAL

## 2021-08-05 MED ORDER — ACETAMINOPHEN 500 MG PO TABS
1000.0000 mg | ORAL_TABLET | Freq: Once | ORAL | Status: AC
  Administered 2021-08-05: 11:00:00 1000 mg via ORAL

## 2021-08-05 MED ORDER — ONDANSETRON HCL 4 MG/2ML IJ SOLN
INTRAMUSCULAR | Status: DC | PRN
  Administered 2021-08-05: 4 mg via INTRAVENOUS

## 2021-08-05 MED ORDER — LACTATED RINGERS IV SOLN: INTRAVENOUS | Status: DC

## 2021-08-05 MED ORDER — BUPIVACAINE-EPINEPHRINE (PF) 0.25% -1:200000 IJ SOLN
INTRAMUSCULAR | Status: DC | PRN
  Administered 2021-08-05: 20 mL

## 2021-08-05 MED ORDER — MIDAZOLAM HCL 5 MG/5ML IJ SOLN
INTRAMUSCULAR | Status: DC | PRN
  Administered 2021-08-05: 2 mg via INTRAVENOUS

## 2021-08-05 MED ORDER — FENTANYL CITRATE (PF) 100 MCG/2ML IJ SOLN
INTRAMUSCULAR | Status: DC | PRN
  Administered 2021-08-05 (×2): 50 ug via INTRAVENOUS

## 2021-08-05 MED ORDER — DEXAMETHASONE SODIUM PHOSPHATE 4 MG/ML IJ SOLN
INTRAMUSCULAR | Status: DC | PRN
  Administered 2021-08-05: 10 mg via INTRAVENOUS

## 2021-08-05 MED ORDER — EPHEDRINE SULFATE 50 MG/ML IJ SOLN
INTRAMUSCULAR | Status: DC | PRN
  Administered 2021-08-05 (×3): 10 mg via INTRAVENOUS

## 2021-08-05 MED ORDER — CHLORHEXIDINE GLUCONATE CLOTH 2 % EX PADS: 6.0000 | MEDICATED_PAD | Freq: Once | CUTANEOUS | Status: DC

## 2021-08-05 MED ORDER — BUPIVACAINE-EPINEPHRINE (PF) 0.25% -1:200000 IJ SOLN
INTRAMUSCULAR | Status: AC
  Filled 2021-08-05: qty 30

## 2021-08-05 MED ORDER — LIDOCAINE HCL (CARDIAC) PF 100 MG/5ML IV SOSY
PREFILLED_SYRINGE | INTRAVENOUS | Status: DC | PRN
  Administered 2021-08-05: 50 mg via INTRAVENOUS

## 2021-08-05 MED ORDER — CEFAZOLIN SODIUM-DEXTROSE 2-4 GM/100ML-% IV SOLN
INTRAVENOUS | Status: AC
  Filled 2021-08-05: qty 100

## 2021-08-05 MED ORDER — GABAPENTIN 300 MG PO CAPS
300.0000 mg | ORAL_CAPSULE | ORAL | Status: AC
  Administered 2021-08-05: 11:00:00 300 mg via ORAL

## 2021-08-05 MED ORDER — AMISULPRIDE (ANTIEMETIC) 5 MG/2ML IV SOLN: 10.0000 mg | Freq: Once | INTRAVENOUS | Status: DC | PRN

## 2021-08-05 MED ORDER — DEXAMETHASONE SODIUM PHOSPHATE 10 MG/ML IJ SOLN
INTRAMUSCULAR | Status: AC
  Filled 2021-08-05: qty 1

## 2021-08-05 MED ORDER — FENTANYL CITRATE (PF) 100 MCG/2ML IJ SOLN
25.0000 ug | INTRAMUSCULAR | Status: DC | PRN
  Administered 2021-08-05 (×2): 50 ug via INTRAVENOUS

## 2021-08-05 MED ORDER — ONDANSETRON HCL 4 MG/2ML IJ SOLN
INTRAMUSCULAR | Status: AC
  Filled 2021-08-05: qty 2

## 2021-08-05 MED ORDER — CELECOXIB 200 MG PO CAPS
ORAL_CAPSULE | ORAL | Status: AC
  Filled 2021-08-05: qty 1

## 2021-08-05 MED ORDER — TRAMADOL HCL 50 MG PO TABS: 50.0000 mg | ORAL_TABLET | Freq: Four times a day (QID) | ORAL | 0 refills | Status: DC | PRN

## 2021-08-05 SURGICAL SUPPLY — 44 items
ADH SKN CLS APL DERMABOND .7 (GAUZE/BANDAGES/DRESSINGS) ×1
APL PRP STRL LF DISP 70% ISPRP (MISCELLANEOUS) ×1
APPLIER CLIP 9.375 MED OPEN (MISCELLANEOUS)
APR CLP MED 9.3 20 MLT OPN (MISCELLANEOUS)
BLADE SURG 15 STRL LF DISP TIS (BLADE) ×1 IMPLANT
BLADE SURG 15 STRL SS (BLADE) ×3
CANISTER SUC SOCK COL 7IN (MISCELLANEOUS) ×1 IMPLANT
CANISTER SUCT 1200ML W/VALVE (MISCELLANEOUS) ×1 IMPLANT
CHLORAPREP W/TINT 26 (MISCELLANEOUS) ×3 IMPLANT
CLIP APPLIE 9.375 MED OPEN (MISCELLANEOUS) IMPLANT
COVER BACK TABLE 60X90IN (DRAPES) ×3 IMPLANT
COVER MAYO STAND STRL (DRAPES) ×3 IMPLANT
COVER PROBE W GEL 5X96 (DRAPES) ×3 IMPLANT
DECANTER SPIKE VIAL GLASS SM (MISCELLANEOUS) IMPLANT
DERMABOND ADVANCED (GAUZE/BANDAGES/DRESSINGS) ×2
DERMABOND ADVANCED .7 DNX12 (GAUZE/BANDAGES/DRESSINGS) ×1 IMPLANT
DRAPE LAPAROSCOPIC ABDOMINAL (DRAPES) ×3 IMPLANT
DRAPE UTILITY XL STRL (DRAPES) ×3 IMPLANT
ELECT COATED BLADE 2.86 ST (ELECTRODE) ×3 IMPLANT
ELECT REM PT RETURN 9FT ADLT (ELECTROSURGICAL) ×3
ELECTRODE REM PT RTRN 9FT ADLT (ELECTROSURGICAL) ×1 IMPLANT
GLOVE SURG ENC MOIS LTX SZ7.5 (GLOVE) ×4 IMPLANT
GLOVE SURG UNDER POLY LF SZ7 (GLOVE) ×4 IMPLANT
GOWN STRL REUS W/ TWL LRG LVL3 (GOWN DISPOSABLE) ×2 IMPLANT
GOWN STRL REUS W/TWL LRG LVL3 (GOWN DISPOSABLE) ×6
ILLUMINATOR WAVEGUIDE N/F (MISCELLANEOUS) IMPLANT
KIT MARKER MARGIN INK (KITS) ×3 IMPLANT
LIGHT WAVEGUIDE WIDE FLAT (MISCELLANEOUS) IMPLANT
NDL HYPO 25X1 1.5 SAFETY (NEEDLE) IMPLANT
NEEDLE HYPO 25X1 1.5 SAFETY (NEEDLE) ×3 IMPLANT
NS IRRIG 1000ML POUR BTL (IV SOLUTION) ×2 IMPLANT
PACK BASIN DAY SURGERY FS (CUSTOM PROCEDURE TRAY) ×3 IMPLANT
PENCIL SMOKE EVACUATOR (MISCELLANEOUS) ×3 IMPLANT
SLEEVE SCD COMPRESS KNEE MED (STOCKING) ×3 IMPLANT
SPONGE T-LAP 18X18 ~~LOC~~+RFID (SPONGE) ×3 IMPLANT
SUT MON AB 4-0 PC3 18 (SUTURE) ×3 IMPLANT
SUT SILK 2 0 SH (SUTURE) IMPLANT
SUT VICRYL 3-0 CR8 SH (SUTURE) ×3 IMPLANT
SYR CONTROL 10ML LL (SYRINGE) ×2 IMPLANT
TOWEL GREEN STERILE FF (TOWEL DISPOSABLE) ×3 IMPLANT
TRAY FAXITRON CT DISP (TRAY / TRAY PROCEDURE) ×3 IMPLANT
TUBE CONNECTING 20'X1/4 (TUBING)
TUBE CONNECTING 20X1/4 (TUBING) ×1 IMPLANT
YANKAUER SUCT BULB TIP NO VENT (SUCTIONS) IMPLANT

## 2021-08-05 NOTE — H&P (Signed)
REFERRING PHYSICIAN: Roselle Locus, MD  PROVIDER: Lindell Noe, MD  MRN: [id number] DOB: 1980/12/01 Subjective  Chief Complaint: Left Breast Complex Sclerosing Lesion   History of Present Illness: Alison Stevens is a 40 y.o. female who is seen today as an office consultation at the request of Dr. Henderson Cloud for evaluation of Left Breast Complex Sclerosing Lesion .   We are asked to see the patient in consultation by Dr. Huntley Dec to evaluate her for lobular carcinoma in situ of the left breast. The patient is a 40 year old white female who recently went for her first screening mammogram. At that time she was found to have a small area of distortion in the outer aspect of the left breast. This was biopsied and came back as lobular carcinoma in situ. She has no family history of breast cancer. She does have 2 daughters that she is concerned about.  Review of Systems: A complete review of systems was obtained from the patient. I have reviewed this information and discussed as appropriate with the patient. See HPI as well for other ROS.  ROS   Medical History: Past Medical History:  Diagnosis Date   Anxiety   CHF (congestive heart failure) (CMS-HCC)   History of stroke   Patient Active Problem List  Diagnosis   Lobular carcinoma in situ (LCIS) of left breast   Past Surgical History:  Procedure Laterality Date   Dilatation and Evacuation (Uterus) 08/31/2017  Dr. Henderson Cloud    Allergies  Allergen Reactions   Hydrocodone-Acetaminophen Nausea and Nausea And Vomiting   Current Outpatient Medications on File Prior to Visit  Medication Sig Dispense Refill   ALPRAZolam (XANAX) 0.25 MG tablet alprazolam 0.25 mg tablet TAKE 1 TABLET BY MOUTH EVERY DAY AS NEEDED   aspirin-acetaminophen-caffeine (EXCEDRIN MIGRAINE) 250-250-65 mg per tablet Take by mouth   No current facility-administered medications on file prior to visit.   Family History  Problem Relation Age of Onset   Coronary  Artery Disease (Blocked arteries around heart) Mother   Coronary Artery Disease (Blocked arteries around heart) Father    Social History   Tobacco Use  Smoking Status Former Smoker   Quit date: 2019   Years since quitting: 3.8  Smokeless Tobacco Never Used    Social History   Socioeconomic History   Marital status: Married  Tobacco Use   Smoking status: Former Smoker  Quit date: 2019  Years since quitting: 3.8   Smokeless tobacco: Never Used  Substance and Sexual Activity   Alcohol use: Yes  Comment: 2x a month   Drug use: Never   Objective:   Vitals:  BP: 106/70  Pulse: 89  Temp: 37.1 C (98.8 F)  SpO2: 100%  Weight: 60.7 kg (133 lb 12.8 oz)  Height: 170.2 cm (5\' 7" )   Body mass index is 20.96 kg/m.  Physical Exam Vitals reviewed.  Constitutional:  General: She is not in acute distress. Appearance: Normal appearance.  HENT:  Head: Normocephalic and atraumatic.  Right Ear: External ear normal.  Left Ear: External ear normal.  Nose: Nose normal.  Mouth/Throat:  Mouth: Mucous membranes are moist.  Pharynx: Oropharynx is clear.  Eyes:  General: No scleral icterus. Extraocular Movements: Extraocular movements intact.  Conjunctiva/sclera: Conjunctivae normal.  Pupils: Pupils are equal, round, and reactive to light.  Cardiovascular:  Rate and Rhythm: Normal rate and regular rhythm.  Pulses: Normal pulses.  Heart sounds: Normal heart sounds.  Pulmonary:  Effort: Pulmonary effort is normal. No respiratory distress.  Breath sounds:  Normal breath sounds.  Abdominal:  General: Bowel sounds are normal.  Palpations: Abdomen is soft.  Tenderness: There is no abdominal tenderness.  Musculoskeletal:  General: No swelling, tenderness or deformity. Normal range of motion.  Cervical back: Normal range of motion and neck supple.  Skin: General: Skin is warm and dry.  Coloration: Skin is not jaundiced.  Neurological:  General: No focal deficit present.  Mental  Status: She is alert and oriented to person, place, and time.  Psychiatric:  Mood and Affect: Mood normal.  Behavior: Behavior normal.     Breast: There is a moderate sized bruise in the lateral aspect of the left breast. Other than this there is no other palpable mass in either breast. There is no palpable axillary, supraclavicular, or cervical lymphadenopathy.  Labs, Imaging and Diagnostic Testing:  Assessment and Plan:  Diagnoses and all orders for this visit:  Lobular carcinoma in situ (LCIS) of left breast    The patient appears to have a small area of lobular carcinoma in situ in the outer aspect of the left breast. At this point I would recommend lumpectomy of this area to make sure that we are not missing something more significant. The presence of this also raises her breast cancer risk during her lifetime to approximately 40 to 50%. Because of this high risk we will refer her to the high risk clinic at the cancer center to talk about risk reduction. I have talked to her in detail about follow-up with observation on 1 end of the spectrum versus bilateral mastectomies on the other end. She understands and will consider all of her options.

## 2021-08-05 NOTE — Op Note (Signed)
08/05/2021  12:48 PM  PATIENT:  Alison Stevens  40 y.o. female  PRE-OPERATIVE DIAGNOSIS:  LEFT BREAST LOBULAR CARCINOMA IN SITU  POST-OPERATIVE DIAGNOSIS:  LEFT BREAST LOBULAR CARCINOMA IN SITU  PROCEDURE:  Procedure(s): LEFT BREAST LUMPECTOMY WITH RADIOACTIVE SEED LOCALIZATION (Left)  SURGEON:  Surgeon(s) and Role:    * Griselda Miner, MD - Primary  PHYSICIAN ASSISTANT:   ASSISTANTS: none   ANESTHESIA:   local and general  EBL:  15 mL   BLOOD ADMINISTERED:none  DRAINS: none   LOCAL MEDICATIONS USED:  MARCAINE     SPECIMEN:  Source of Specimen:  left breast tissue  DISPOSITION OF SPECIMEN:  PATHOLOGY  COUNTS:  YES  TOURNIQUET:  * No tourniquets in log *  DICTATION: .Dragon Dictation  After informed consent was obtained the patient was brought into the operating room and placed in the supine position on the operating table.  After adequate induction of general anesthesia the patient's left breast was prepped with ChloraPrep, allowed to dry, and draped in usual sterile manner.  An appropriate timeout was performed.  Previously an I-125 seed was placed in the lateral aspect of the left breast to mark an area of lobular carcinoma in situ.  The neoprobe was set to I-125 in the area of radioactivity was readily identified.  The area around this was infiltrated with quarter percent Marcaine.  A curvilinear incision was made along the outer edge of the areola of the left breast with a 15 blade knife.  The incision was carried through the skin and subcutaneous tissue sharply with the electrocautery.  Dissection was then carried towards the radioactive seed in the outer aspect of the left breast.  Once I more closely approached the radioactive seed I then removed a circular portion of breast tissue sharply with the electrocautery around the radioactive seed while checking the area of radioactivity frequently.  Once the specimen was removed it was oriented with the appropriate paint  colors.  A specimen radiograph was obtained that showed the clip and seed to be near the center of the specimen.  The specimen was then sent to pathology for further evaluation.  The lumpectomy cavity was evaluated and the surrounding tissue felt very normal.  Hemostasis was achieved using the Bovie electrocautery.  The wound was irrigated with saline and infiltrated with more quarter percent Marcaine.  The deep layer of the wound was closed with interrupted 3-0 Vicryl stitches.  The skin was then closed with interrupted 4-0 Monocryl subcuticular stitches.  Dermabond dressings were applied.  The patient tolerated the procedure well.  At the end of the case all needle sponge and instrument counts were correct.  The patient was then awakened and taken recovery in stable condition.  PLAN OF CARE: Discharge to home after PACU  PATIENT DISPOSITION:  PACU - hemodynamically stable.   Delay start of Pharmacological VTE agent (>24hrs) due to surgical blood loss or risk of bleeding: not applicable

## 2021-08-05 NOTE — Discharge Instructions (Signed)
Next dose of Tylenol/NSAID's/Ibuprofen after 5pm as needed for pain.   Post Anesthesia Home Care Instructions  Activity: Get plenty of rest for the remainder of the day. A responsible individual must stay with you for 24 hours following the procedure.  For the next 24 hours, DO NOT: -Drive a car -Advertising copywriter -Drink alcoholic beverages -Take any medication unless instructed by your physician -Make any legal decisions or sign important papers.  Meals: Start with liquid foods such as gelatin or soup. Progress to regular foods as tolerated. Avoid greasy, spicy, heavy foods. If nausea and/or vomiting occur, drink only clear liquids until the nausea and/or vomiting subsides. Call your physician if vomiting continues.  Special Instructions/Symptoms: Your throat may feel dry or sore from the anesthesia or the breathing tube placed in your throat during surgery. If this causes discomfort, gargle with warm salt water. The discomfort should disappear within 24 hours.  If you had a scopolamine patch placed behind your ear for the management of post- operative nausea and/or vomiting:  1. The medication in the patch is effective for 72 hours, after which it should be removed.  Wrap patch in a tissue and discard in the trash. Wash hands thoroughly with soap and water. 2. You may remove the patch earlier than 72 hours if you experience unpleasant side effects which may include dry mouth, dizziness or visual disturbances. 3. Avoid touching the patch. Wash your hands with soap and water after contact with the patch.

## 2021-08-05 NOTE — Interval H&P Note (Signed)
History and Physical Interval Note:  08/05/2021 11:47 AM  Alison Stevens  has presented today for surgery, with the diagnosis of LEFT BREAST LOBULAR CARCINOMA IN SITU.  The various methods of treatment have been discussed with the patient and family. After consideration of risks, benefits and other options for treatment, the patient has consented to  Procedure(s): LEFT BREAST LUMPECTOMY WITH RADIOACTIVE SEED LOCALIZATION (Left) as a surgical intervention.  The patient's history has been reviewed, patient examined, no change in status, stable for surgery.  I have reviewed the patient's chart and labs.  Questions were answered to the patient's satisfaction.     Chevis Pretty III

## 2021-08-05 NOTE — Anesthesia Postprocedure Evaluation (Signed)
Anesthesia Post Note  Patient: Alison Stevens  Procedure(s) Performed: LEFT BREAST LUMPECTOMY WITH RADIOACTIVE SEED LOCALIZATION (Left: Breast)     Patient location during evaluation: PACU Anesthesia Type: General Level of consciousness: sedated Pain management: pain level controlled Vital Signs Assessment: post-procedure vital signs reviewed and stable Respiratory status: spontaneous breathing and respiratory function stable Cardiovascular status: stable Postop Assessment: no apparent nausea or vomiting Anesthetic complications: no   No notable events documented.  Last Vitals:  Vitals:   08/05/21 1315 08/05/21 1323  BP: 139/82   Pulse: 85 84  Resp: 15 13  Temp:    SpO2: 100% 100%    Last Pain:  Vitals:   08/05/21 1323  TempSrc:   PainSc: 3                  Candra R Allyn Bartelson

## 2021-08-05 NOTE — Anesthesia Preprocedure Evaluation (Addendum)
Anesthesia Evaluation  Patient identified by MRN, date of birth, ID band Patient awake    Reviewed: Allergy & Precautions, NPO status , Patient's Chart, lab work & pertinent test results  History of Anesthesia Complications (+) PONV  Airway Mallampati: I  TM Distance: >3 FB Neck ROM: Full    Dental no notable dental hx.    Pulmonary asthma , former smoker,    Pulmonary exam normal breath sounds clear to auscultation       Cardiovascular Exercise Tolerance: Good negative cardio ROS Normal cardiovascular exam+ Valvular Problems/Murmurs MR  Rhythm:Regular Rate:Normal  Normal sinus rhythm with sinus arrhythmia Normal ECG No previous tracing Confirmed by Nanetta Batty [id number]) on 08/04/2021 5:42:48 PM  Echo 07/21/21: Left ventricular ejection fraction, by estimation, is 60 to 65%. The left ventricle has normal function. The left ventricle has no regional wall motion abnormalities. 1. 2. Right ventricular systolic function is normal. The right ventricular size is normal. 3. No left atrial/left atrial appendage thrombus was detected. Cannot exclude rupture of a very small primary chorda tendina (see image 15). The mitral valve is otherwise normal in structure. Mild to moderate mitral valve regurgitation. No evidence of mitral stenosis. 4. The aortic valve is normal in structure. Aortic valve regurgitation is trivial. No aortic stenosis is present. 5. The inferior vena cava is normal in size with greater than 50% respiratory variability, suggesting right atrial pressure of 3 mmHg.   Neuro/Psych  Headaches, PSYCHIATRIC DISORDERS Anxiety CVA (evidence of old cerebellar strokes found incidentally...on ASA 81mg  (not taking))    GI/Hepatic negative GI ROS, Neg liver ROS,   Endo/Other  negative endocrine ROS  Renal/GU negative Renal ROS  negative genitourinary   Musculoskeletal negative musculoskeletal ROS (+)   Abdominal    Peds negative pediatric ROS (+)  Hematology negative hematology ROS (+)   Anesthesia Other Findings Breast cancer  Reproductive/Obstetrics negative OB ROS                            Anesthesia Physical Anesthesia Plan  ASA: 2  Anesthesia Plan: General   Post-op Pain Management: Tylenol PO (pre-op)   Induction:   PONV Risk Score and Plan: 4 or greater and Scopolamine patch - Pre-op, Midazolam, Dexamethasone and Ondansetron  Airway Management Planned: LMA  Additional Equipment:   Intra-op Plan:   Post-operative Plan: Extubation in OR  Informed Consent: I have reviewed the patients History and Physical, chart, labs and discussed the procedure including the risks, benefits and alternatives for the proposed anesthesia with the patient or authorized representative who has indicated his/her understanding and acceptance.     Dental advisory given  Plan Discussed with: Anesthesiologist and CRNA  Anesthesia Plan Comments:        Anesthesia Quick Evaluation

## 2021-08-05 NOTE — Anesthesia Procedure Notes (Signed)
Procedure Name: LMA Insertion Date/Time: 08/05/2021 12:52 PM Performed by: Karen Kitchens, CRNA Pre-anesthesia Checklist: Patient identified, Emergency Drugs available, Suction available and Patient being monitored Patient Re-evaluated:Patient Re-evaluated prior to induction Oxygen Delivery Method: Circle system utilized Preoxygenation: Pre-oxygenation with 100% oxygen Induction Type: IV induction Ventilation: Mask ventilation without difficulty LMA: LMA inserted LMA Size: 4.0 Number of attempts: 1 Airway Equipment and Method: Bite block Placement Confirmation: positive ETCO2 Tube secured with: Tape Dental Injury: Teeth and Oropharynx as per pre-operative assessment

## 2021-08-05 NOTE — Transfer of Care (Signed)
Immediate Anesthesia Transfer of Care Note  Patient: Alison Stevens  Procedure(s) Performed: LEFT BREAST LUMPECTOMY WITH RADIOACTIVE SEED LOCALIZATION (Left: Breast)  Patient Location: PACU  Anesthesia Type:General  Level of Consciousness: awake, alert  and oriented  Airway & Oxygen Therapy: Patient Spontanous Breathing and Patient connected to face mask oxygen  Post-op Assessment: Report given to RN and Post -op Vital signs reviewed and stable  Post vital signs: Reviewed and stable  Last Vitals:  Vitals Value Taken Time  BP    Temp    Pulse    Resp    SpO2      Last Pain:  Vitals:   08/05/21 1052  TempSrc: Oral  PainSc: 0-No pain         Complications: No notable events documented.

## 2021-08-06 LAB — SURGICAL PATHOLOGY

## 2021-08-06 NOTE — Progress Notes (Signed)
Left message stating courtesy call and if any questions or concerns please call the doctors office.  

## 2021-08-10 ENCOUNTER — Encounter (HOSPITAL_BASED_OUTPATIENT_CLINIC_OR_DEPARTMENT_OTHER): Payer: Self-pay | Admitting: General Surgery

## 2021-08-12 ENCOUNTER — Telehealth: Payer: Self-pay | Admitting: Genetic Counselor

## 2021-08-12 ENCOUNTER — Ambulatory Visit: Payer: Self-pay | Admitting: Genetic Counselor

## 2021-08-12 ENCOUNTER — Encounter: Payer: Self-pay | Admitting: Genetic Counselor

## 2021-08-12 DIAGNOSIS — D0502 Lobular carcinoma in situ of left breast: Secondary | ICD-10-CM

## 2021-08-12 DIAGNOSIS — Z1379 Encounter for other screening for genetic and chromosomal anomalies: Secondary | ICD-10-CM

## 2021-08-12 NOTE — Progress Notes (Signed)
HPI:   Alison Stevens was previously seen in the [place] clinic due to a personal history of LCIS, family history of cancer, and concerns regarding a hereditary predisposition to cancer. Please refer to our prior cancer genetics clinic note for more information regarding our discussion, assessment and recommendations, at the time. Alison Stevens recent genetic test results were disclosed to her, as were recommendations warranted by these results. These results and recommendations are discussed in more detail below.  CANCER HISTORY:  Oncology History  Lobular carcinoma in situ (LCIS) of left breast  07/27/2021 Initial Diagnosis   Lobular carcinoma in situ (LCIS) of left breast    Genetic Testing   [facility] CancerNext-Expanded was Negative. Report date is 08/11/2021.  The CancerNext-Expanded gene panel offered by [facility] and includes sequencing, rearrangement, and RNA analysis for the following 77 genes: AIP, ALK, APC, ATM, AXIN2, BAP1, BARD1, BLM, BMPR1A, BRCA1, BRCA2, BRIP1, CDC73, CDH1, CDK4, CDKN1B, CDKN2A, CHEK2, CTNNA1, DICER1, FANCC, FH, FLCN, GALNT12, KIF1B, LZTR1, MAX, MEN1, MET, MLH1, MSH2, MSH3, MSH6, MUTYH, NBN, NF1, NF2, NTHL1, PALB2, PHOX2B, PMS2, POT1, PRKAR1A, PTCH1, PTEN, RAD51C, RAD51D, RB1, RECQL, RET, SDHA, SDHAF2, SDHB, SDHC, SDHD, SMAD4, SMARCA4, SMARCB1, SMARCE1, STK11, SUFU, TMEM127, TP53, TSC1, TSC2, VHL and XRCC2 (sequencing and deletion/duplication); EGFR, EGLN1, HOXB13, KIT, MITF, PDGFRA, POLD1, and POLE (sequencing only); EPCAM and GREM1 (deletion/duplication only).      FAMILY HISTORY:  We obtained a detailed, 4-generation family history.  Significant diagnoses are listed below:      Family History  Problem Relation Age of Onset   Migraines Sister     Lymphoma Maternal Uncle     Colon cancer Maternal Uncle     Cancer Maternal Grandmother          possibly breast cancer   Dementia Paternal Grandmother     Stroke Paternal Grandmother      Prostate cancer Paternal Grandfather     Seizures Other        Alison Stevens's maternal uncle was diagnosed with colon cancer and a second maternal uncle was diagnosed with lymphoma. She reports her maternal grandmother possibly had breast cancer. Her paternal grandfather died due to metastatic prostate cancer. Alison Stevens is unaware of previous family history of genetic testing for hereditary cancer risks. There is <1% reported Ashkenazi Jewish ancestry.   GENETIC TEST RESULTS:  The [facility] CancerNext-Expanded Panel found no pathogenic mutations.   The CancerNext-Expanded gene panel offered by [facility] and includes sequencing, rearrangement, and RNA analysis for the following 77 genes: AIP, ALK, APC, ATM, AXIN2, BAP1, BARD1, BLM, BMPR1A, BRCA1, BRCA2, BRIP1, CDC73, CDH1, CDK4, CDKN1B, CDKN2A, CHEK2, CTNNA1, DICER1, FANCC, FH, FLCN, GALNT12, KIF1B, LZTR1, MAX, MEN1, MET, MLH1, MSH2, MSH3, MSH6, MUTYH, NBN, NF1, NF2, NTHL1, PALB2, PHOX2B, PMS2, POT1, PRKAR1A, PTCH1, PTEN, RAD51C, RAD51D, RB1, RECQL, RET, SDHA, SDHAF2, SDHB, SDHC, SDHD, SMAD4, SMARCA4, SMARCB1, SMARCE1, STK11, SUFU, TMEM127, TP53, TSC1, TSC2, VHL and XRCC2 (sequencing and deletion/duplication); EGFR, EGLN1, HOXB13, KIT, MITF, PDGFRA, POLD1, and POLE (sequencing only); EPCAM and GREM1 (deletion/duplication only).     The test report has been scanned into EPIC and is located under the Molecular Pathology section of the Results Review tab.  A portion of the result report is included below for reference. Genetic testing reported out on 08/11/2021.       Even though a pathogenic variant was not identified, possible explanations for her personal history of LCIS and family history of cancer may include: There may be no hereditary risk for  cancer in the family. The Alison Stevens's personal history of LCIS and/or her family of cancer may be due to other genetic or environmental factors. There may be a gene mutation in one of these genes that  current testing methods cannot detect, but that chance is small. There could be another gene that has not yet been discovered, or that we have not yet tested, that is responsible for the cancer diagnoses in the family.   Therefore, it is important to remain in touch with cancer genetics in the future so that we can continue to offer Alison Stevens the most up to date genetic testing.   ADDITIONAL GENETIC TESTING:  We discussed with Alison Stevens that her genetic testing was fairly extensive.  If there are genes identified to increase cancer risk that can be analyzed in the future, we would be happy to discuss and coordinate this testing at that time.    CANCER SCREENING RECOMMENDATIONS:  Alison Stevens's test result is considered negative (normal).  This means that we have not identified a hereditary cause for her LCIS and family history of cancer at this time.   An individual's cancer risk and medical management are not determined by genetic test results alone. Overall cancer risk assessment incorporates additional factors, including personal medical history, family history, and any available genetic information that may result in a personalized plan for cancer prevention and surveillance. Therefore, it is recommended she continue to follow the cancer management and screening guidelines provided by her oncology and primary healthcare provider.  Based on the reported personal and family history, specific cancer screenings for Alison Stevens include:  Breast Cancer Screening:  The Tyrer-Cuzick model is one of multiple prediction models developed to estimate an individual's lifetime risk of developing breast cancer. The Tyrer-Cuzick model is endorsed by the [employer]  (NCCN). This model includes many risk factors such as family history, endogenous estrogen exposure, and benign breast disease. The calculation is highly-dependent on the accuracy of clinical data provided by the  patient and can change over time. The Tyrer-Cuzick model may be repeated to reflect new information in her personal or family history in the future.   Alison Stevens's Tyrer-Cuzick risk score is approximately 50%.  For women with a greater than 20% lifetime risk of breast cancer, the NCCN recommends the following:    1.   Clinical encounter every 6-12 months to begin when identified as being at increased risk, but not before age 38    2.   Annual mammograms, tomosynthesis is recommended starting 10 years earlier than the youngest breast cancer diagnosis in the family or at age 88 (whichever comes first), but not before age 49     3.   Annual breast MRI starting 10 years earlier than the youngest breast cancer diagnosis in the family or at age 35 (whichever comes first), but not before age 52  Ms. Talkington has already been referred to [facility]'s high risk clinic by Dr. Marlou Starks  RECOMMENDATIONS FOR FAMILY MEMBERS:   Since she did not inherit a mutation in a cancer predisposition gene included on this panel, her children could not have inherited a mutation from her in one of these genes.  FOLLOW-UP:  Cancer genetics is a rapidly advancing field and it is possible that new genetic tests will be appropriate for her and/or her family members in the future. We encouraged her to remain in contact with cancer genetics on an annual basis so we can update her  personal and family histories and let her know of advances in cancer genetics that may benefit this family.   Our contact number was provided. Ms. Bisch's questions were answered to her satisfaction, and she knows she is welcome to call us at anytime with additional questions or concerns.   Alison Passy, MS, [facility] Genetic Counselor [email or web address] (P) [phone]

## 2021-08-12 NOTE — Progress Notes (Signed)
HEMATOLOGY-ONCOLOGY TELEPHONE VISIT PROGRESS NOTE  I connected with Alison Stevens on 08/13/2021 at 12:15 PM EST by telephone and verified that I am speaking with the correct person using two identifiers.  I discussed the limitations, risks, security and privacy concerns of performing an evaluation and management service by telephone and the availability of in person appointments.  I also discussed with the patient that there may be a patient responsible charge related to this service. The patient expressed understanding and agreed to proceed.   History of Present Illness: Alison Stevens is a 40 y.o. female with above-mentioned history of LCIS and atypical lobular hyperplasia of the left breast. Left lumpectomy on 08/05/2021 showed with complex sclerosing lesion with lobular neoplasia. She presents via telephone today for follow-up.  She is healing and recovering very well from recent surgery.  Oncology History  Lobular carcinoma in situ (LCIS) of left breast  07/27/2021 Initial Diagnosis   Lobular carcinoma in situ (LCIS) of left breast    Genetic Testing   [facility] CancerNext-Expanded was Negative. Report date is 08/11/2021.  The CancerNext-Expanded gene panel offered by [facility] and includes sequencing, rearrangement, and RNA analysis for the following 77 genes: AIP, ALK, APC, ATM, AXIN2, BAP1, BARD1, BLM, BMPR1A, BRCA1, BRCA2, BRIP1, CDC73, CDH1, CDK4, CDKN1B, CDKN2A, CHEK2, CTNNA1, DICER1, FANCC, FH, FLCN, GALNT12, KIF1B, LZTR1, MAX, MEN1, MET, MLH1, MSH2, MSH3, MSH6, MUTYH, NBN, NF1, NF2, NTHL1, PALB2, PHOX2B, PMS2, POT1, PRKAR1A, PTCH1, PTEN, RAD51C, RAD51D, RB1, RECQL, RET, SDHA, SDHAF2, SDHB, SDHC, SDHD, SMAD4, SMARCA4, SMARCB1, SMARCE1, STK11, SUFU, TMEM127, TP53, TSC1, TSC2, VHL and XRCC2 (sequencing and deletion/duplication); EGFR, EGLN1, HOXB13, KIT, MITF, PDGFRA, POLD1, and POLE (sequencing only); EPCAM and GREM1 (deletion/duplication only).       Assessment Plan:   Lobular carcinoma in situ (LCIS) of left breast 05/31/2021: Left breast biopsy: Complex sclerosing lesion with LCIS and [facility] 08/05/2021: Left lumpectomy: Complex sclerosing lesion with ALH and LCIS.  No invasive cancer seen.  Risk reduction strategies: 1. Tamoxifen or raloxifene are contraindicated because of prior history of 3 strokes.  If her neurologist would clear her then we can consider adding it at a later time. 2. Recommended annual mammograms and breast exams and annual breast MRIs.  Screening mammograms to be done through her OB/GYN usually September or October timeframe. Breast MRI as will be done initially annually and then less often later on.  The next MRI will be ordered for April 2024.  Return to clinic in 1 year    I discussed the assessment and treatment plan with the patient. The patient was provided an opportunity to ask questions and all were answered. The patient agreed with the plan and demonstrated an understanding of the instructions. The patient was advised to call back or seek an in-person evaluation if the symptoms worsen or if the condition fails to improve as anticipated.   Total time spent: 11 mins including non-face to face time and time spent for planning, charting and coordination of care  Rulon Eisenmenger, MD 08/13/2021    I, Alison Stevens, am acting as scribe for Alison Lose, MD.  I have reviewed the above documentation for accuracy and completeness, and I agree with the above.

## 2021-08-12 NOTE — Telephone Encounter (Signed)
I contacted Ms. Szuch to discuss her genetic testing results. No pathogenic variants were identified in the 77 genes analyzed. Detailed clinic note to follow.  The test report has been scanned into EPIC and is located under the Molecular Pathology section of the Results Review tab.  A portion of the result report is included below for reference.   Lalla Brothers, MS, [facility] Genetic Counselor [email or web address] (P) [phone]

## 2021-08-13 ENCOUNTER — Inpatient Hospital Stay (HOSPITAL_BASED_OUTPATIENT_CLINIC_OR_DEPARTMENT_OTHER): Payer: BC Managed Care – PPO | Admitting: Hematology and Oncology

## 2021-08-13 DIAGNOSIS — D0502 Lobular carcinoma in situ of left breast: Secondary | ICD-10-CM

## 2021-08-13 NOTE — Assessment & Plan Note (Signed)
05/31/2021: Left breast biopsy: Complex sclerosing lesion with LCIS and [facility] 08/05/2021: Left lumpectomy: Complex sclerosing lesion with ALH and LCIS.  No invasive cancer seen.  Risk reduction strategies: 1. Tamoxifen or raloxifene are contraindicated because of prior history of 3 strokes.  If her neurologist would clear her then we can consider adding it at a later time. 2. Recommended annual mammograms and breast exams and annual breast MRIs.   There is no change in her surveillance recommendations. Return to clinic in 1 year

## 2021-10-20 ENCOUNTER — Encounter (HOSPITAL_COMMUNITY): Payer: Self-pay

## 2021-10-22 NOTE — Progress Notes (Signed)
? ?NEUROLOGY FOLLOW UP OFFICE NOTE ? ?Alison Stevens ?[id number] ? ?Assessment/Plan:  ? ?Migraine without aura, without status migrainosus, not intractable  ?Intermittent polyopia - unclear etiology but possibly migrainous  ?Intermittent vertigo - likely migrainous as it has been recurrent.  I don't believe further stroke workup is warranted ?Chronic cerebellar infarcts on brain MRI - unclear etiology but suspect it could have occurred in utero.   ?White matter abnormality on brain MRI - nonspecific.  May represent sequela of migraine. ?Mitral valve regurgitation - monitored by cardiology ? ?  ?Would repeat MRI of brain with and without contrast in one year. ?Migraines infrequent, medication not warranted. ?Follow up in one year after MRI or sooner if needed ?  ?  ?  ?Subjective:  ?Alison Stevens is a 41 year old right-handed female who follows up for migraines, vertigo, fasciculations. ? ?UPDATE: ?MRI of brain with and without contrast on 04/29/2021 personally reviewed showed few scattered nonspecific T2/FLAIR hyperintense signal within the cerebral white matter as well as multiple small chronic lacunar infarcts within the bilateral cerebellar hemispheres.  LDL was 140 and she was started on Crestor 5mg  but she never started it.  Hypercoagulable panel was ordered but I don't believe it was performed.  CTA of head and neck on 05/24/2021 personally reviewed showed no large vessel occlusion or significant stenosis.  2D echo with  bubble study on 05/27/2021 whiched revealed normal EF and negative bubble study but did demonstate eccentric MRI.  She followed up with cardiology.  TEE performed on 07/21/2021 showed normal LVEF of 60-65% with no PFO or left atrial/left atrial appendage thrombus but did demonstrate mild to moderate MVR and could not rule out rupture of a very small primary chorda tendina.  Recommended continuing to monitor the MVR.  She was diagnosed with left breast LCIS and atypical lobular hyperplasia  and underwent lumpectomy in late December. ? ?For migraines, she was started on nortriptyline.   She never filled it because she stopped having headaches.  They only now occur once a month to once every other month. She has had a few more episodes of vertigo and double vision or polyopia.  They are infrequent.  Last one was 3 to 4 weeks ago.   ? ?Current NSAIDS/analgesics:  Excedrin ?Current triptans:  none ?Current ergotamine:  none ?Current anti-emetic:  none ?Current muscle relaxants:  none ?Current Antihypertensive medications:  none ?Current Antidepressant medications:  none ?Current Anticonvulsant medications:  none ?Current anti-CGRP:  none ?Current Vitamins/Herbal/Supplements:  none ?Current Antihistamines/Decongestants:  none ?Other therapy:  none ?Hormone/birth control:  Tri-Sprintec ?Other medications:  Xanax ?  ?HISTORY: ?Around 2020-2021 (1 1/2 to 2 years ago), she started experiencing migraines.  It was around the time she started birth control medication but believes symptoms started prior to that.  She has severe headaches, various location (across forehead, unilateral either side, top of head), throbbing and pressure.  Associated nausea, photophobia, phonophobia.  2 Excedrin will knock it out in a few hours but may last all day.  They occur on average once a month.   ?  ?Around the same time, she developed episodes of vertigo.  It would occur randomly and not necessarily with movement or change in position.  It feels like the floor is moving, sometimes spinning sensation.  At first, it would occur on and off for a couple of days.  Lately, they have been occurring once a month.  Denies headache.  Denies correlation with her migraines. ?  ?Since  January 2022, she has had intermittent double vision.  She notes images stacked on top of one another.  Often, it involves multiple images.  Sometimes preceded by vertigo and nausea.  No associated headache.  Usually lasts 10 minutes but once 20-30 minutes.   It has occurred about 5 times since January. Eye exam in February was normal.  CT head on 10/01/2020 personally reviewed showed subcentimeter hypodensity within the bilateral cerebellar hemispheres.   ?  ?For the past few months, she reports muscle twitches.  Initially involved the legs but then spread to the entire body.   ?  ?She reports short-term memory issues.  Trouble sleeping.  ? ?  ?Past NSAIDS/analgesics:  Excedrin ?Past abortive triptans:  none ?Past abortive ergotamine:  none ?Past muscle relaxants:  none ?Past anti-emetic:  none ?Past antihypertensive medications:  none ?Past antidepressant medications:  trazodone (sleep), cannot tolerate SSRIs ?Past anticonvulsant medications:  none ?Past anti-CGRP:  none ?Past vitamins/Herbal/Supplements:  none ?Past antihistamines/decongestants:  none ?Other past therapies:  none ?  ?Family History: ?Paternal cousin - seizure disorder ?Daughter - febrile seizures ?Paternal grandmother - dementia ? ?PAST MEDICAL HISTORY: ?Past Medical History:  ?Diagnosis Date  ? Asthma   ? Cerebellar stroke (HCC) 05/24/2021  ? H/O cold sores   ? History of lobular carcinoma in situ (LCIS) of breast   ? Mitral valve regurgitation 07/21/2021  ? SVD (spontaneous vaginal delivery)   ? x 2  ? ? ?MEDICATIONS: ?Current Outpatient Medications on File Prior to Visit  ?Medication Sig Dispense Refill  ? ALPRAZolam (XANAX) 0.25 MG tablet Take 0.25 mg by mouth at bedtime as needed for anxiety or sleep.    ? aspirin-acetaminophen-caffeine (EXCEDRIN MIGRAINE) 250-250-65 MG tablet Take 2 tablets by mouth every 6 (six) hours as needed for headache.    ? bismuth subsalicylate (PEPTO BISMOL) 262 MG/15ML suspension Take 30 mLs by mouth every 6 (six) hours as needed for diarrhea or loose stools or indigestion.    ? calcium carbonate (TUMS - DOSED IN MG ELEMENTAL CALCIUM) 500 MG chewable tablet Chew 1-2 tablets (200-400 mg of elemental calcium total) by mouth daily as needed for indigestion or heartburn.     ? doxylamine, Sleep, (UNISOM SLEEPTABS) 25 MG tablet Take 12.5-25 mg by mouth at bedtime.    ? ibuprofen (ADVIL) 200 MG tablet Take 2-4 tablets (400-800 mg total) by mouth every 8 (eight) hours as needed for moderate pain or headache. 30 tablet 0  ? traMADol (ULTRAM) 50 MG tablet Take 1 tablet (50 mg total) by mouth every 6 (six) hours as needed. 20 tablet 0  ? tretinoin (RETIN-A) 0.05 % cream Apply 1 application topically every other day. At night    ? ?Current Facility-Administered Medications on File Prior to Visit  ?Medication Dose Route Frequency Provider Last Rate Last Admin  ? normal saline NICU flush  0.5-1.7 mL Intravenous PRN Shon Millet R, DO      ? ? ?ALLERGIES: ?Allergies  ?Allergen Reactions  ? Vicodin [Hydrocodone-Acetaminophen] Nausea And Vomiting  ? ? ?FAMILY HISTORY: ?Family History  ?Problem Relation Age of Onset  ? Migraines Sister   ? Lymphoma Maternal Uncle   ? Colon cancer Maternal Uncle   ? Cancer Maternal Grandmother   ?     possibly breast cancer  ? Dementia Paternal Grandmother   ? Stroke Paternal Grandmother   ? Prostate cancer Paternal Grandfather   ? Seizures Other   ? ? ?  ?Objective:  ?Blood pressure 106/74, resp. rate 18,  height 5\' 7"  (1.702 m), weight 128 lb (58.1 kg). ?General: No acute distress.  Patient appears well-groomed.   ?Head:  Normocephalic/atraumatic ?Eyes:  Fundi examined but not visualized ?Neck: supple, no paraspinal tenderness, full range of motion ?Heart:  Regular rate and rhythm ?Lungs:  Clear to auscultation bilaterally ?Back: No paraspinal tenderness ?Neurological Exam: alert and oriented to person, place, and time.  Speech fluent and not dysarthric, language intact.  CN II-XII intact. Bulk and tone normal, muscle strength 5/5 throughout.  Sensation to light touch intact.  Deep tendon reflexes 2+ throughout, toes downgoing.  Finger to nose testing intact.  Gait normal, Romberg negative. ? ? ?Shon MilletAdam Meagan Spease, DO ? ? ? ? ? ? ? ?

## 2021-10-25 ENCOUNTER — Other Ambulatory Visit: Payer: Self-pay

## 2021-10-25 ENCOUNTER — Encounter: Payer: Self-pay | Admitting: Neurology

## 2021-10-25 ENCOUNTER — Ambulatory Visit (INDEPENDENT_AMBULATORY_CARE_PROVIDER_SITE_OTHER): Payer: BC Managed Care – PPO | Admitting: Neurology

## 2021-10-25 VITALS — BP 106/74 | Resp 18 | Ht 67.0 in | Wt 128.0 lb

## 2021-10-25 DIAGNOSIS — I639 Cerebral infarction, unspecified: Secondary | ICD-10-CM

## 2021-10-25 DIAGNOSIS — G43009 Migraine without aura, not intractable, without status migrainosus: Secondary | ICD-10-CM

## 2021-10-25 DIAGNOSIS — R9082 White matter disease, unspecified: Secondary | ICD-10-CM

## 2021-10-25 DIAGNOSIS — G43109 Migraine with aura, not intractable, without status migrainosus: Secondary | ICD-10-CM | POA: Diagnosis not present

## 2021-10-25 NOTE — Patient Instructions (Signed)
Repeat MRI of brain with and without contrast in one year ?Follow up afterwards or as needed. ?

## 2022-04-19 ENCOUNTER — Encounter: Payer: Self-pay | Admitting: Family Medicine

## 2022-04-19 ENCOUNTER — Ambulatory Visit (INDEPENDENT_AMBULATORY_CARE_PROVIDER_SITE_OTHER): Payer: BC Managed Care – PPO | Admitting: Family Medicine

## 2022-04-19 VITALS — BP 112/70 | HR 84 | Temp 97.8°F | Ht 67.0 in | Wt 136.6 lb

## 2022-04-19 DIAGNOSIS — H1032 Unspecified acute conjunctivitis, left eye: Secondary | ICD-10-CM | POA: Diagnosis not present

## 2022-04-19 MED ORDER — CIPROFLOXACIN HCL 0.3 % OP SOLN: 1.0000 [drp] | OPHTHALMIC | 0 refills | Status: DC

## 2022-04-19 NOTE — Progress Notes (Signed)
Established Patient Office Visit  Subjective   Patient ID: Alison Stevens, female    DOB: June 02, 1981  Age: 41 y.o. MRN: [id number]  Chief Complaint  Patient presents with   Eye Problem    Left eye little draining painful, red and itchy started last night.     Eye Problem  Associated symptoms include an eye discharge and eye redness. Pertinent negatives include no blurred vision, double vision, photophobia or weakness.   evaluation of 1 day history of mild irritation of OS.  There was no injury or change in vision.  Mild a.m. matting this morning.  She is a contact wearer and remove them.  Husband has a more serious "pinkeye".  There was a viral illness seem to run through the home last week.    Review of Systems  Constitutional:  Negative for chills, diaphoresis, malaise/fatigue and weight loss.  HENT: Negative.    Eyes:  Positive for discharge and redness. Negative for blurred vision, double vision, photophobia and pain.  Cardiovascular:  Negative for chest pain.  Gastrointestinal:  Negative for abdominal pain.  Genitourinary: Negative.   Musculoskeletal:  Negative for falls and myalgias.  Neurological:  Negative for speech change, loss of consciousness and weakness.  Psychiatric/Behavioral: Negative.        Objective:     BP 112/70 (BP Location: Left Arm, Patient Position: Sitting, Cuff Size: Normal)   Pulse 84   Temp 97.8 F (36.6 C) (Temporal)   Ht 5\' 7"  (1.702 m)   Wt 136 lb 9.6 oz (62 kg)   SpO2 97%   BMI 21.39 kg/m    Physical Exam Constitutional:      General: She is not in acute distress.    Appearance: Normal appearance. She is not ill-appearing, toxic-appearing or diaphoretic.  HENT:     Head: Normocephalic and atraumatic.     Right Ear: External ear normal.     Left Ear: External ear normal.  Eyes:     General: No scleral icterus.       Right eye: No discharge.        Left eye: No discharge.     Extraocular Movements: Extraocular movements  intact.     Conjunctiva/sclera: Conjunctivae normal.     Left eye: Left conjunctiva is not injected. No chemosis, exudate or hemorrhage.    Pupils: Pupils are equal, round, and reactive to light.   Cardiovascular:     Rate and Rhythm: Normal rate and regular rhythm.  Pulmonary:     Effort: Pulmonary effort is normal. No respiratory distress.     Breath sounds: Normal breath sounds.  Abdominal:     General: Bowel sounds are normal.  Skin:    General: Skin is warm and dry.  Neurological:     Mental Status: She is alert and oriented to person, place, and time.  Psychiatric:        Mood and Affect: Mood normal.        Behavior: Behavior normal.      No results found for any visits on 04/19/22.    The ASCVD Stevens score (Arnett DK, et al., 2019) failed to calculate for the following reasons:   The patient has a prior MI or stroke diagnosis    Assessment & Plan:   Problem List Items Addressed This Visit   None Visit Diagnoses     Acute conjunctivitis of left eye, unspecified acute conjunctivitis type    -  Primary   Relevant Medications  ciprofloxacin (CILOXAN) 0.3 % ophthalmic solution       Return in about 3 days (around 04/22/2022), or if symptoms worsen or fail to improve.  Ophthalmology follow-up if not improving on the Cipro.  Alison Sax, MD

## 2022-05-03 DIAGNOSIS — Z6821 Body mass index (BMI) 21.0-21.9, adult: Secondary | ICD-10-CM | POA: Diagnosis not present

## 2022-05-03 DIAGNOSIS — Z01419 Encounter for gynecological examination (general) (routine) without abnormal findings: Secondary | ICD-10-CM | POA: Diagnosis not present

## 2022-05-03 DIAGNOSIS — Z1231 Encounter for screening mammogram for malignant neoplasm of breast: Secondary | ICD-10-CM | POA: Diagnosis not present

## 2022-06-08 ENCOUNTER — Other Ambulatory Visit: Payer: Self-pay | Admitting: Obstetrics and Gynecology

## 2022-06-08 DIAGNOSIS — Z803 Family history of malignant neoplasm of breast: Secondary | ICD-10-CM

## 2022-08-05 ENCOUNTER — Ambulatory Visit (INDEPENDENT_AMBULATORY_CARE_PROVIDER_SITE_OTHER): Payer: BC Managed Care – PPO | Admitting: Family Medicine

## 2022-08-05 ENCOUNTER — Encounter: Payer: Self-pay | Admitting: Family Medicine

## 2022-08-05 VITALS — BP 118/70 | HR 89 | Temp 98.5°F | Ht 67.0 in | Wt 135.2 lb

## 2022-08-05 DIAGNOSIS — F439 Reaction to severe stress, unspecified: Secondary | ICD-10-CM | POA: Diagnosis not present

## 2022-08-05 DIAGNOSIS — Z23 Encounter for immunization: Secondary | ICD-10-CM

## 2022-08-05 DIAGNOSIS — L409 Psoriasis, unspecified: Secondary | ICD-10-CM

## 2022-08-05 MED ORDER — CLOBETASOL PROPIONATE 0.05 % EX FOAM: Freq: Two times a day (BID) | CUTANEOUS | 0 refills | Status: DC

## 2022-08-05 NOTE — Progress Notes (Signed)
Established Patient Office Visit   Subjective:  Patient ID: Alison Stevens, female    DOB: [birth year]  Age: 41 y.o. MRN: [id number]  Chief Complaint  Patient presents with   Psoriasis    Psoriasis flare up need refill on medication    Psoriasis Associated symptoms include itching.   Encounter Diagnoses  Name Primary?   Need for immunization against influenza Yes   Psoriasis of scalp    Stress    Longstanding history of psoriasis of the scalp.  It has responded to clobetasol foam in the past.  Does not seem to be quite as effective recently.  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  Follow-up as planned with dermatology in the second week of January.  She is not regularly exercising at this time   Review of Systems  Constitutional: Negative.   HENT: Negative.    Eyes:  Negative for blurred vision, discharge and redness.  Respiratory: Negative.    Cardiovascular: Negative.   Gastrointestinal:  Negative for abdominal pain.  Genitourinary: Negative.   Musculoskeletal: Negative.  Negative for myalgias.  Skin:  Positive for itching and rash.  Neurological:  Negative for tingling, loss of consciousness and weakness.  Endo/Heme/Allergies:  Negative for polydipsia.     Current Outpatient Medications:    ALPRAZolam (XANAX) 0.25 MG tablet, Take 0.25 mg by mouth at bedtime as needed for anxiety or sleep., Disp: , Rfl:    aspirin-acetaminophen-caffeine (EXCEDRIN MIGRAINE) 250-250-65 MG tablet, Take 2 tablets by mouth every 6 (six) hours as needed for headache., Disp: , Rfl:    ciprofloxacin (CILOXAN) 0.3 % ophthalmic solution, Place 1 drop into the left eye every 4 (four) hours while awake. For 5 days., Disp: 5 mL, Rfl: 0   doxylamine, Sleep, (UNISOM SLEEPTABS) 25 MG tablet, Take 12.5-25 mg by mouth at bedtime., Disp: , Rfl:    ibuprofen (ADVIL) 200 MG tablet, Take 2-4 tablets (400-800 mg total) by mouth every 8 (eight) hours as needed for moderate  pain or headache., Disp: 30 tablet, Rfl: 0   clobetasol (OLUX) 0.05 % topical foam, Apply topically 2 (two) times daily., Disp: 150 g, Rfl: 0 No current facility-administered medications for this visit.  Facility-Administered Medications Ordered in Other Visits:    normal saline NICU flush, 0.5-1.7 mL, Intravenous, PRN, Jaffe, Adam R, DO   Objective:     BP 118/70 (BP Location: Right Arm, Patient Position: Sitting, Cuff Size: Normal)   Pulse 89   Temp 98.5 F (36.9 C) (Temporal)   Ht 5\' 7"  (1.702 m)   Wt 135 lb 3.2 oz (61.3 kg)   LMP  (LMP Unknown)   SpO2 96%   BMI 21.18 kg/m    Physical Exam Constitutional:      General: She is not in acute distress.    Appearance: Normal appearance. She is not ill-appearing, toxic-appearing or diaphoretic.  HENT:     Head: Normocephalic and atraumatic.     Right Ear: External ear normal.     Left Ear: External ear normal.  Eyes:     General: No scleral icterus.       Right eye: No discharge.        Left eye: No discharge.     Extraocular Movements: Extraocular movements intact.     Conjunctiva/sclera: Conjunctivae normal.  Cardiovascular:     Rate and Rhythm: Normal rate and regular rhythm.  Pulmonary:     Effort: Pulmonary effort is normal.  No respiratory distress.     Breath sounds: Normal breath sounds.  Skin:    General: Skin is warm and dry.       Neurological:     Mental Status: She is alert and oriented to person, place, and time.  Psychiatric:        Mood and Affect: Mood normal.        Behavior: Behavior normal.      No results found for any visits on 08/05/22.    The ASCVD Risk score (Arnett DK, et al., 2019) failed to calculate for the following reasons:   The patient has a prior MI or stroke diagnosis    Assessment & Plan:   Need for immunization against influenza -     Flu Vaccine QUAD 2mo+IM (Fluarix, Fluzone & Alfiuria Quad PF)  Psoriasis of scalp -     Clobetasol Propionate; Apply topically 2 (two)  times daily.  Dispense: 150 g; Refill: 0  Stress    Return if symptoms worsen or fail to improve.  Continue clobetasol foam twice daily.  May need immunomodulators.  Follow-up with dermatology as planned.  Encouraged regular exercise for relief of stress.  Mliss Sax, MD

## 2022-08-18 DIAGNOSIS — L400 Psoriasis vulgaris: Secondary | ICD-10-CM | POA: Diagnosis not present

## 2022-08-22 ENCOUNTER — Other Ambulatory Visit: Payer: BC Managed Care – PPO

## 2022-08-23 ENCOUNTER — Encounter: Payer: Self-pay | Admitting: Family Medicine

## 2022-10-17 ENCOUNTER — Ambulatory Visit
Admission: RE | Admit: 2022-10-17 | Discharge: 2022-10-17 | Disposition: A | Discharge status: Home / Self Care | Payer: BC Managed Care – PPO | Source: Ambulatory Visit | Attending: Obstetrics and Gynecology | Admitting: Obstetrics and Gynecology

## 2022-10-17 DIAGNOSIS — Z1239 Encounter for other screening for malignant neoplasm of breast: Secondary | ICD-10-CM | POA: Diagnosis not present

## 2022-10-17 DIAGNOSIS — Z803 Family history of malignant neoplasm of breast: Secondary | ICD-10-CM

## 2022-10-17 MED ORDER — GADOPICLENOL 0.5 MMOL/ML IV SOLN
6.0000 mL | Freq: Once | INTRAVENOUS | Status: AC | PRN
  Administered 2022-10-17: 6 mL via INTRAVENOUS

## 2022-10-18 ENCOUNTER — Other Ambulatory Visit: Payer: Self-pay | Admitting: Obstetrics and Gynecology

## 2022-10-18 DIAGNOSIS — R928 Other abnormal and inconclusive findings on diagnostic imaging of breast: Secondary | ICD-10-CM

## 2022-10-19 ENCOUNTER — Telehealth: Payer: Self-pay | Admitting: Neurology

## 2022-10-19 NOTE — Telephone Encounter (Signed)
Pt called in stating she is currently scheduled for an MRI of her brain on 10/24/22 that Dr. Tomi Likens ordered. She said she now has to have a breast biopsy done and they will be putting a metal clip in her breast. That will cause her to not be able to do any other MRI's for a while. She remembered Dr. Tomi Likens telling her she needed to get the brain MRI done in March, but she cannot remember exactly why. Possibly due to insurance. She is wanting to move the brain MRI back to May so she can deal with her breast issue. They are wanting her to move her biopsy up.  She is needing to find out ASAP.

## 2022-10-19 NOTE — Telephone Encounter (Signed)
Patient advised per last note MRI is schedule for March one year from the last one and her visit last year.

## 2022-10-20 NOTE — Telephone Encounter (Signed)
LMOVM for patient, on 10/19/22 okay to move MRI per dr.Jaffe to May.  The MRI was to follow up and see if there were changes in the nonspecific spots involving the brain.  It is not urgent and can be moved out to May

## 2022-10-20 NOTE — Telephone Encounter (Signed)
  The MRI was to follow up and see if there were changes in the nonspecific spots involving the brain.  It is not urgent and can be moved out to May.

## 2022-10-24 ENCOUNTER — Ambulatory Visit
Admission: RE | Admit: 2022-10-24 | Discharge: 2022-10-24 | Disposition: A | Discharge status: Home / Self Care | Payer: BC Managed Care – PPO | Source: Ambulatory Visit | Attending: Neurology | Admitting: Neurology

## 2022-10-24 DIAGNOSIS — J3489 Other specified disorders of nose and nasal sinuses: Secondary | ICD-10-CM | POA: Diagnosis not present

## 2022-10-24 DIAGNOSIS — R9082 White matter disease, unspecified: Secondary | ICD-10-CM

## 2022-10-24 MED ORDER — GADOPICLENOL 0.5 MMOL/ML IV SOLN
6.0000 mL | Freq: Once | INTRAVENOUS | Status: AC | PRN
  Administered 2022-10-24: 6 mL via INTRAVENOUS

## 2022-10-25 NOTE — Progress Notes (Signed)
Virtual Visit via Video Note  Consent was obtained for video visit:  Yes.   Answered questions that patient had about telehealth interaction:  Yes.   I discussed the limitations, risks, security and privacy concerns of performing an evaluation and management service by telemedicine. I also discussed with the patient that there may be a patient responsible charge related to this service. The patient expressed understanding and agreed to proceed.  Pt location: Home Physician Location: office Name of referring provider:  Mliss Sax,* I connected with Alison Stevens at patients initiation/request on 10/26/2022 at  2:10 PM EDT by video enabled telemedicine application and verified that I am speaking with the correct person using two identifiers. Pt MRN:  [id number] Pt DOB:  29-Oct-1980 Video Participants:  Alison Stevens  Assessment/Plan:    Migraine without aura,, without status migrainosus, not intractable - stable Intermittent polyopia - unclear etiology but possibly migrainous  Chronic cerebellar infarcts on brain MRI - unclear etiology but suspect it could have occurred in utero.   White matter abnormalities on brain MRI, likely sequela of migraine. Mitral valve regurgitation - monitored by cardiology     I would still like to check a hypercoagulable panel.   Migraines infrequent, medication not warranted. Further recommendations pending results of labs.  If unremarkable, follow up as needed.  Note:  She has LCIS.  Heme/once would like to start tamoxifen.  From a neurologic standpoint, I have no objections.       Subjective:  Alison Stevens is a 42 year old right-handed female with LCIS s/p left lumpectomy who follows up for migraines, vertigo, fasciculations.   UPDATE: Repeat MRI of brain with and without contrast on 10/24/2022 personally reviewed and was stable, showing unchanged mild T2/FLAIR hyperintensities in the white matter.  It appears that a  hypercoagulable panel was never performed.     Since last visit, she has had 2 episodes of double vision or polyopia.     Current NSAIDS/analgesics:  Excedrin Current triptans:  none Current ergotamine:  none Current anti-emetic:  none Current muscle relaxants:  none Current Antihypertensive medications:  none Current Antidepressant medications:  none Current Anticonvulsant medications:  none Current anti-CGRP:  none Current Vitamins/Herbal/Supplements:  none Current Antihistamines/Decongestants:  none Other therapy:  none Hormone/birth control:  Tri-Sprintec Other medications:  Xanax   HISTORY: Around 2020-2021 (1 1/2 to 2 years ago), she started experiencing migraines.  It was around the time she started birth control medication but believes symptoms started prior to that.  She has severe headaches, various location (across forehead, unilateral either side, top of head), throbbing and pressure.  Associated nausea, photophobia, phonophobia.  2 Excedrin will knock it out in a few hours but may last all day.  They occur on average once a month.     Around the same time, she developed episodes of vertigo.  It would occur randomly and not necessarily with movement or change in position.  It feels like the floor is moving, sometimes spinning sensation.  At first, it would occur on and off for a couple of days.  Lately, they have been occurring once a month.  Denies headache.  Denies correlation with her migraines.   Since January 2022, she has had intermittent double vision.  She notes images stacked on top of one another.  Often, it involves multiple images.  Sometimes preceded by vertigo and nausea.  No associated headache.  Usually lasts 10 minutes but once 20-30 minutes.  It has  occurred about 5 times since January. Eye exam in February 2022 was normal.  CT head on 10/01/2020 personally reviewed showed subcentimeter hypodensity within the bilateral cerebellar hemispheres.  MRI of brain with  and without contrast on 04/29/2021 showed few scattered nonspecific T2/FLAIR hyperintense signal within the cerebral white matter as well as multiple small chronic lacunar infarcts within the bilateral cerebellar hemispheres.  LDL was 140 and she was started on Crestor 5mg  but she never started it..  CTA of head and neck on 05/24/2021 personally reviewed showed no large vessel occlusion or significant stenosis.  2D echo with  bubble study on 05/27/2021 whiched revealed normal EF and negative bubble study but did demonstate eccentric MRI.  She followed up with cardiology.  TEE performed on 07/21/2021 showed normal LVEF of 60-65% with no PFO or left atrial/left atrial appendage thrombus but did demonstrate mild to moderate MVR and could not rule out rupture of a very small primary chorda tendina.  Recommended continuing to monitor the MVR.     For the past few months, she reports muscle twitches.  Initially involved the legs but then spread to the entire body.     She reports short-term memory issues.  Trouble sleeping.      Past NSAIDS/analgesics:  Excedrin Past abortive triptans:  none Past abortive ergotamine:  none Past muscle relaxants:  none Past anti-emetic:  none Past antihypertensive medications:  none Past antidepressant medications:  trazodone (sleep), cannot tolerate SSRIs Past anticonvulsant medications:  none Past anti-CGRP:  none Past vitamins/Herbal/Supplements:  none Past antihistamines/decongestants:  none Other past therapies:  none   Family History: Paternal cousin - seizure disorder Daughter - febrile seizures Paternal grandmother - dementia  Past Medical History: Past Medical History:  Diagnosis Date   Asthma    Cerebellar stroke (HCC) 05/24/2021   H/O cold sores    History of lobular carcinoma in situ (LCIS) of breast    Mitral valve regurgitation 07/21/2021   SVD (spontaneous vaginal delivery)    x 2    Medications: Outpatient Encounter Medications as of  10/26/2022  Medication Sig   ALPRAZolam (XANAX) 0.25 MG tablet Take 0.25 mg by mouth at bedtime as needed for anxiety or sleep.   aspirin-acetaminophen-caffeine (EXCEDRIN MIGRAINE) 250-250-65 MG tablet Take 2 tablets by mouth every 6 (six) hours as needed for headache.   ciprofloxacin (CILOXAN) 0.3 % ophthalmic solution Place 1 drop into the left eye every 4 (four) hours while awake. For 5 days.   clobetasol (OLUX) 0.05 % topical foam Apply topically 2 (two) times daily.   doxylamine, Sleep, (UNISOM SLEEPTABS) 25 MG tablet Take 12.5-25 mg by mouth at bedtime.   ibuprofen (ADVIL) 200 MG tablet Take 2-4 tablets (400-800 mg total) by mouth every 8 (eight) hours as needed for moderate pain or headache.   Facility-Administered Encounter Medications as of 10/26/2022  Medication   normal saline NICU flush    Allergies: Allergies  Allergen Reactions   Vicodin [Hydrocodone-Acetaminophen] Nausea And Vomiting    Family History: Family History  Problem Relation Age of Onset   Migraines Sister    Lymphoma Maternal Uncle    Colon cancer Maternal Uncle    Cancer Maternal Grandmother        possibly breast cancer   Dementia Paternal Grandmother    Stroke Paternal Grandmother    Prostate cancer Paternal Grandfather    Seizures Other     Observations/Objective:   No acute distress.  Alert and oriented.  Speech fluent and not dysarthric.  Language intact.     Follow Up Instructions:    -I discussed the assessment and treatment plan with the patient. The patient was provided an opportunity to ask questions and all were answered. The patient agreed with the plan and demonstrated an understanding of the instructions.   The patient was advised to call back or seek an in-person evaluation if the symptoms worsen or if the condition fails to improve as anticipated.   Cira Servant, DO

## 2022-10-26 ENCOUNTER — Encounter: Payer: Self-pay | Admitting: Neurology

## 2022-10-26 ENCOUNTER — Telehealth (INDEPENDENT_AMBULATORY_CARE_PROVIDER_SITE_OTHER): Payer: BC Managed Care – PPO | Admitting: Neurology

## 2022-10-26 DIAGNOSIS — I639 Cerebral infarction, unspecified: Secondary | ICD-10-CM | POA: Diagnosis not present

## 2022-10-26 DIAGNOSIS — R9082 White matter disease, unspecified: Secondary | ICD-10-CM

## 2022-10-26 DIAGNOSIS — G43009 Migraine without aura, not intractable, without status migrainosus: Secondary | ICD-10-CM

## 2022-10-27 ENCOUNTER — Ambulatory Visit
Admission: RE | Admit: 2022-10-27 | Discharge: 2022-10-27 | Disposition: A | Discharge status: Home / Self Care | Payer: BC Managed Care – PPO | Source: Ambulatory Visit | Attending: Obstetrics and Gynecology | Admitting: Obstetrics and Gynecology

## 2022-10-27 DIAGNOSIS — N6324 Unspecified lump in the left breast, lower inner quadrant: Secondary | ICD-10-CM | POA: Diagnosis not present

## 2022-10-27 DIAGNOSIS — D242 Benign neoplasm of left breast: Secondary | ICD-10-CM | POA: Diagnosis not present

## 2022-10-27 DIAGNOSIS — R928 Other abnormal and inconclusive findings on diagnostic imaging of breast: Secondary | ICD-10-CM

## 2022-10-27 MED ORDER — GADOPICLENOL 0.5 MMOL/ML IV SOLN
6.0000 mL | Freq: Once | INTRAVENOUS | Status: AC | PRN
  Administered 2022-10-27: 6 mL via INTRAVENOUS

## 2022-12-08 ENCOUNTER — Ambulatory Visit (INDEPENDENT_AMBULATORY_CARE_PROVIDER_SITE_OTHER): Payer: BC Managed Care – PPO | Admitting: Family Medicine

## 2022-12-08 ENCOUNTER — Encounter: Payer: Self-pay | Admitting: Family Medicine

## 2022-12-08 VITALS — BP 108/74 | HR 92 | Temp 98.8°F | Ht 67.0 in | Wt 133.2 lb

## 2022-12-08 DIAGNOSIS — B002 Herpesviral gingivostomatitis and pharyngotonsillitis: Secondary | ICD-10-CM | POA: Diagnosis not present

## 2022-12-08 DIAGNOSIS — L409 Psoriasis, unspecified: Secondary | ICD-10-CM | POA: Diagnosis not present

## 2022-12-08 DIAGNOSIS — J0100 Acute maxillary sinusitis, unspecified: Secondary | ICD-10-CM

## 2022-12-08 MED ORDER — GUAIFENESIN ER 600 MG PO TB12: 600.0000 mg | ORAL_TABLET | Freq: Two times a day (BID) | ORAL | 0 refills | Status: AC | PRN

## 2022-12-08 MED ORDER — CLOBETASOL PROPIONATE 0.05 % EX FOAM: Freq: Two times a day (BID) | CUTANEOUS | 0 refills | Status: DC

## 2022-12-08 MED ORDER — AMOXICILLIN 875 MG PO TABS: 875.0000 mg | ORAL_TABLET | Freq: Two times a day (BID) | ORAL | 0 refills | Status: AC

## 2022-12-08 MED ORDER — VALACYCLOVIR HCL 1 G PO TABS: 500.0000 mg | ORAL_TABLET | Freq: Two times a day (BID) | ORAL | 1 refills | Status: DC

## 2022-12-08 NOTE — Progress Notes (Signed)
Established Patient Office Visit   Subjective:  Patient ID: Alison Stevens, female    DOB: 09-15-1980  Age: 42 y.o. MRN: [id number]  Chief Complaint  Patient presents with   Sinusitis    Sinus infection chest congestion, pressure in face symptoms x 1 week.     Sinusitis Associated symptoms include congestion and coughing. Pertinent negatives include no shortness of breath.   Encounter Diagnoses  Name Primary?   Acute non-recurrent maxillary sinusitis Yes   Psoriasis of scalp    Oral herpes    For evaluation of a 1 week history of URI symptoms now with facial pressure and cheekbones milky rhinorrhea, cough productive of purulent postnasal drip.  Subjective fevers.  Denies wheezing, asthma history or difficulty breathing through her chest.  History of oral shingles associated with warmer temperatures.  Resolving recent outbreak.  Needs refill of Olux for scalp psoriasis.  It is effective and works for her.   Review of Systems  Constitutional: Negative.   HENT:  Positive for congestion and sinus pain.   Eyes:  Negative for blurred vision, discharge and redness.  Respiratory:  Positive for cough. Negative for shortness of breath and wheezing.   Cardiovascular: Negative.   Gastrointestinal:  Negative for abdominal pain.  Genitourinary: Negative.   Musculoskeletal: Negative.  Negative for myalgias.  Skin:  Positive for rash. Negative for itching.  Neurological:  Negative for tingling, loss of consciousness and weakness.  Endo/Heme/Allergies:  Negative for polydipsia.     Current Outpatient Medications:    ALPRAZolam (XANAX) 0.25 MG tablet, Take 0.25 mg by mouth at bedtime as needed for anxiety or sleep., Disp: , Rfl:    aspirin-acetaminophen-caffeine (EXCEDRIN MIGRAINE) 250-250-65 MG tablet, Take 2 tablets by mouth every 6 (six) hours as needed for headache., Disp: , Rfl:    doxylamine, Sleep, (UNISOM SLEEPTABS) 25 MG tablet, Take 12.5-25 mg by mouth at bedtime., Disp: , Rfl:     ibuprofen (ADVIL) 200 MG tablet, Take 2-4 tablets (400-800 mg total) by mouth every 8 (eight) hours as needed for moderate pain or headache., Disp: 30 tablet, Rfl: 0   amoxicillin (AMOXIL) 875 MG tablet, Take 1 tablet (875 mg total) by mouth 2 (two) times daily for 10 days., Disp: 20 tablet, Rfl: 0   clobetasol (OLUX) 0.05 % topical foam, Apply topically 2 (two) times daily., Disp: 150 g, Rfl: 0   guaiFENesin (MUCINEX) 600 MG 12 hr tablet, Take 1 tablet (600 mg total) by mouth 2 (two) times daily as needed for up to 10 days., Disp: 20 tablet, Rfl: 0   valACYclovir (VALTREX) 1000 MG tablet, Take 0.5 tablets (500 mg total) by mouth 2 (two) times daily for 3 days., Disp: 9 tablet, Rfl: 1 No current facility-administered medications for this visit.  Facility-Administered Medications Ordered in Other Visits:    normal saline NICU flush, 0.5-1.7 mL, Intravenous, PRN, Jaffe, Adam R, DO   Objective:     BP 108/74 (BP Location: Right Arm, Patient Position: Sitting, Cuff Size: Normal)   Pulse 92   Temp 98.8 F (37.1 C) (Temporal)   Ht  (1.702 m)   Wt 133 lb 3.2 oz (60.4 kg)   SpO2 97%   BMI 20.86 kg/m    Physical Exam Constitutional:      General: She is not in acute distress.    Appearance: Normal appearance. She is not ill-appearing, toxic-appearing or diaphoretic.  HENT:     Head: Normocephalic and atraumatic.     Right Ear:  Tympanic membrane, ear canal and external ear normal.     Left Ear: Tympanic membrane, ear canal and external ear normal.     Mouth/Throat:     Mouth: Mucous membranes are moist.     Pharynx: Oropharynx is clear. No oropharyngeal exudate or posterior oropharyngeal erythema.  Eyes:     General: No scleral icterus.       Right eye: No discharge.        Left eye: No discharge.     Extraocular Movements: Extraocular movements intact.     Conjunctiva/sclera: Conjunctivae normal.     Pupils: Pupils are equal, round, and reactive to light.  Cardiovascular:      Rate and Rhythm: Normal rate and regular rhythm.  Pulmonary:     Effort: Pulmonary effort is normal. No respiratory distress.     Breath sounds: Normal breath sounds. No wheezing or rales.  Skin:    General: Skin is warm and dry.  Neurological:     Mental Status: She is alert and oriented to person, place, and time.  Psychiatric:        Mood and Affect: Mood normal.        Behavior: Behavior normal.      No results found for any visits on 12/08/22.    The ASCVD Risk score (Arnett DK, et al., 2019) failed to calculate for the following reasons:   The patient has a prior MI or stroke diagnosis    Assessment & Plan:   Acute non-recurrent maxillary sinusitis -     Amoxicillin; Take 1 tablet (875 mg total) by mouth 2 (two) times daily for 10 days.  Dispense: 20 tablet; Refill: 0 -     guaiFENesin ER; Take 1 tablet (600 mg total) by mouth 2 (two) times daily as needed for up to 10 days.  Dispense: 20 tablet; Refill: 0  Psoriasis of scalp -     Clobetasol Propionate; Apply topically 2 (two) times daily.  Dispense: 150 g; Refill: 0  Oral herpes -     valACYclovir HCl; Take 0.5 tablets (500 mg total) by mouth 2 (two) times daily for 3 days.  Dispense: 9 tablet; Refill: 1    Return if symptoms worsen or fail to improve.    Mliss Sax, MD

## 2023-03-22 ENCOUNTER — Other Ambulatory Visit: Payer: Self-pay | Admitting: Obstetrics and Gynecology

## 2023-03-22 DIAGNOSIS — Z803 Family history of malignant neoplasm of breast: Secondary | ICD-10-CM

## 2023-04-04 ENCOUNTER — Other Ambulatory Visit: Payer: Self-pay | Admitting: Family Medicine

## 2023-04-04 DIAGNOSIS — B002 Herpesviral gingivostomatitis and pharyngotonsillitis: Secondary | ICD-10-CM

## 2023-05-08 DIAGNOSIS — Z6821 Body mass index (BMI) 21.0-21.9, adult: Secondary | ICD-10-CM | POA: Diagnosis not present

## 2023-05-08 DIAGNOSIS — Z113 Encounter for screening for infections with a predominantly sexual mode of transmission: Secondary | ICD-10-CM | POA: Diagnosis not present

## 2023-05-08 DIAGNOSIS — Z124 Encounter for screening for malignant neoplasm of cervix: Secondary | ICD-10-CM | POA: Diagnosis not present

## 2023-05-08 DIAGNOSIS — Z01419 Encounter for gynecological examination (general) (routine) without abnormal findings: Secondary | ICD-10-CM | POA: Diagnosis not present

## 2023-05-25 DIAGNOSIS — Z1231 Encounter for screening mammogram for malignant neoplasm of breast: Secondary | ICD-10-CM | POA: Diagnosis not present

## 2023-06-22 ENCOUNTER — Ambulatory Visit
Admission: RE | Admit: 2023-06-22 | Discharge: 2023-06-22 | Disposition: A | Discharge status: Home / Self Care | Payer: BC Managed Care – PPO | Source: Ambulatory Visit | Attending: Obstetrics and Gynecology | Admitting: Obstetrics and Gynecology

## 2023-06-22 DIAGNOSIS — Z853 Personal history of malignant neoplasm of breast: Secondary | ICD-10-CM | POA: Diagnosis not present

## 2023-06-22 DIAGNOSIS — Z803 Family history of malignant neoplasm of breast: Secondary | ICD-10-CM

## 2023-06-22 MED ORDER — GADOPICLENOL 0.5 MMOL/ML IV SOLN
6.0000 mL | Freq: Once | INTRAVENOUS | Status: AC | PRN
  Administered 2023-06-22: 6 mL via INTRAVENOUS

## 2023-08-09 ENCOUNTER — Other Ambulatory Visit: Payer: Self-pay | Admitting: Family Medicine

## 2023-08-09 DIAGNOSIS — B002 Herpesviral gingivostomatitis and pharyngotonsillitis: Secondary | ICD-10-CM

## 2023-08-17 ENCOUNTER — Other Ambulatory Visit: Payer: Self-pay | Admitting: Family Medicine

## 2023-08-17 DIAGNOSIS — L409 Psoriasis, unspecified: Secondary | ICD-10-CM

## 2023-08-17 NOTE — Telephone Encounter (Signed)
 Copied from CRM [id number]. Topic: Clinical - Medication Refill >> Aug 17, 2023  3:39 PM Laymon HERO wrote: Most Recent Primary Care Visit:  Provider: BERNETA ELSIE SAYRE  Department: [facility]  Visit Type: OFFICE VISIT  Date: 12/08/2022  Medication: ***  Has the patient contacted their pharmacy?  (Agent: If no, request that the patient contact the pharmacy for the refill. If patient does not wish to contact the pharmacy document the reason why and proceed with request.) (Agent: If yes, when and what did the pharmacy advise?)  Is this the correct pharmacy for this prescription?  If no, delete pharmacy and type the correct one.  This is the patient's preferred pharmacy:  [id number] - [place], [place] - [address] AT [address] [place] [address] Phone: [phone] Fax: [phone]   Has the prescription been filled recently?   Is the patient out of the medication?   Has the patient been seen for an appointment in the last year OR does the patient have an upcoming appointment?   Can we respond through MyChart?   Agent: Please be advised that Rx refills may take up to 3 business days. We ask that you follow-up with your pharmacy.

## 2024-02-09 ENCOUNTER — Other Ambulatory Visit: Payer: Self-pay | Admitting: Family Medicine

## 2024-02-09 DIAGNOSIS — B002 Herpesviral gingivostomatitis and pharyngotonsillitis: Secondary | ICD-10-CM

## 2024-02-23 DIAGNOSIS — S91201A Unspecified open wound of right great toe with damage to nail, initial encounter: Secondary | ICD-10-CM | POA: Diagnosis not present

## 2024-04-04 ENCOUNTER — Other Ambulatory Visit: Payer: Self-pay | Admitting: Family Medicine

## 2024-04-04 DIAGNOSIS — L409 Psoriasis, unspecified: Secondary | ICD-10-CM

## 2024-06-20 ENCOUNTER — Other Ambulatory Visit: Payer: Self-pay | Admitting: Obstetrics and Gynecology

## 2024-06-20 DIAGNOSIS — Z853 Personal history of malignant neoplasm of breast: Secondary | ICD-10-CM

## 2024-08-21 ENCOUNTER — Other Ambulatory Visit

## 2024-12-17 ENCOUNTER — Other Ambulatory Visit
